# Patient Record
Sex: Female | Born: 1955 | Race: Black or African American | Hispanic: No | Marital: Married | Smoking: Never smoker
Health system: Southern US, Community
[De-identification: ages and names within clinical notes are randomized; demographics above are authoritative.]

## PROBLEM LIST (undated history)

## (undated) DIAGNOSIS — Z8619 Personal history of other infectious and parasitic diseases: Secondary | ICD-10-CM

## (undated) DIAGNOSIS — E559 Vitamin D deficiency, unspecified: Secondary | ICD-10-CM

## (undated) DIAGNOSIS — J069 Acute upper respiratory infection, unspecified: Secondary | ICD-10-CM

## (undated) DIAGNOSIS — Z860100 Personal history of colon polyps, unspecified: Secondary | ICD-10-CM

## (undated) DIAGNOSIS — G5603 Carpal tunnel syndrome, bilateral upper limbs: Secondary | ICD-10-CM

## (undated) DIAGNOSIS — K529 Noninfective gastroenteritis and colitis, unspecified: Secondary | ICD-10-CM

## (undated) DIAGNOSIS — K52832 Lymphocytic colitis: Secondary | ICD-10-CM

## (undated) DIAGNOSIS — F17200 Nicotine dependence, unspecified, uncomplicated: Secondary | ICD-10-CM

## (undated) DIAGNOSIS — I10 Essential (primary) hypertension: Secondary | ICD-10-CM

## (undated) HISTORY — DX: Carpal tunnel syndrome, bilateral upper limbs: G56.03

## (undated) HISTORY — DX: Nicotine dependence, unspecified, uncomplicated: F17.200

## (undated) HISTORY — DX: Personal history of other infectious and parasitic diseases: Z86.19

## (undated) HISTORY — DX: Lymphocytic colitis: K52.832

## (undated) HISTORY — DX: Vitamin D deficiency, unspecified: E55.9

## (undated) HISTORY — PX: TONSILLECTOMY: SUR1361

## (undated) HISTORY — DX: Personal history of colonic polyps: Z86.010

## (undated) HISTORY — PX: OTHER SURGICAL HISTORY: SHX169

## (undated) HISTORY — DX: Noninfective gastroenteritis and colitis, unspecified: K52.9

## (undated) HISTORY — PX: TUBAL LIGATION: SHX77

## (undated) HISTORY — DX: Personal history of colon polyps, unspecified: Z86.0100

## (undated) HISTORY — PX: COLONOSCOPY: SHX174

---

## 1998-05-04 ENCOUNTER — Encounter: Payer: Self-pay | Admitting: Emergency Medicine

## 1998-05-04 ENCOUNTER — Emergency Department (HOSPITAL_COMMUNITY): Admission: EM | Admit: 1998-05-04 | Discharge: 1998-05-04 | Payer: Self-pay | Admitting: Emergency Medicine

## 1998-05-24 ENCOUNTER — Other Ambulatory Visit: Admission: RE | Admit: 1998-05-24 | Discharge: 1998-05-24 | Payer: Self-pay | Admitting: Obstetrics & Gynecology

## 2000-03-07 ENCOUNTER — Other Ambulatory Visit: Admission: RE | Admit: 2000-03-07 | Discharge: 2000-03-07 | Payer: Self-pay | Admitting: Obstetrics and Gynecology

## 2002-01-06 ENCOUNTER — Ambulatory Visit (HOSPITAL_COMMUNITY): Admission: RE | Admit: 2002-01-06 | Discharge: 2002-01-06 | Payer: Self-pay | Admitting: Obstetrics and Gynecology

## 2002-01-06 ENCOUNTER — Encounter: Payer: Self-pay | Admitting: Obstetrics and Gynecology

## 2002-03-20 ENCOUNTER — Other Ambulatory Visit: Admission: RE | Admit: 2002-03-20 | Discharge: 2002-03-20 | Payer: Self-pay | Admitting: Obstetrics and Gynecology

## 2003-09-22 ENCOUNTER — Other Ambulatory Visit: Admission: RE | Admit: 2003-09-22 | Discharge: 2003-09-22 | Payer: Self-pay | Admitting: Obstetrics and Gynecology

## 2004-08-30 ENCOUNTER — Encounter: Admission: RE | Admit: 2004-08-30 | Discharge: 2004-08-30 | Payer: Self-pay | Admitting: Obstetrics and Gynecology

## 2004-11-17 ENCOUNTER — Other Ambulatory Visit: Admission: RE | Admit: 2004-11-17 | Discharge: 2004-11-17 | Payer: Self-pay | Admitting: Obstetrics and Gynecology

## 2005-01-19 ENCOUNTER — Emergency Department (HOSPITAL_COMMUNITY): Admission: EM | Admit: 2005-01-19 | Discharge: 2005-01-19 | Payer: Self-pay | Admitting: Family Medicine

## 2005-05-20 ENCOUNTER — Emergency Department (HOSPITAL_COMMUNITY): Admission: EM | Admit: 2005-05-20 | Discharge: 2005-05-20 | Payer: Self-pay | Admitting: Emergency Medicine

## 2006-02-26 ENCOUNTER — Encounter: Admission: RE | Admit: 2006-02-26 | Discharge: 2006-02-26 | Payer: Self-pay | Admitting: Family Medicine

## 2006-07-02 ENCOUNTER — Ambulatory Visit: Payer: Self-pay | Admitting: Internal Medicine

## 2006-08-07 ENCOUNTER — Ambulatory Visit: Payer: Self-pay | Admitting: Internal Medicine

## 2008-09-24 ENCOUNTER — Encounter: Admission: RE | Admit: 2008-09-24 | Discharge: 2008-09-24 | Payer: Self-pay | Admitting: Family Medicine

## 2008-12-20 ENCOUNTER — Emergency Department (HOSPITAL_COMMUNITY): Admission: EM | Admit: 2008-12-20 | Discharge: 2008-12-20 | Payer: Self-pay | Admitting: Emergency Medicine

## 2009-12-21 ENCOUNTER — Encounter: Admission: RE | Admit: 2009-12-21 | Discharge: 2009-12-21 | Payer: Self-pay | Admitting: Obstetrics and Gynecology

## 2010-08-14 ENCOUNTER — Encounter: Payer: Self-pay | Admitting: Obstetrics and Gynecology

## 2010-12-09 NOTE — Assessment & Plan Note (Signed)
[facility]                             PULMONARY OFFICE NOTE   NAME:[name] [name], [name] [name]                        MRN:          [id number]  DATE:07/02/2006                            DOB:          1956-04-08    REASON FOR CONSULTATION:  Cough.   HISTORY:  This is a 55 year old black female, never smoked.  Carries a  diagnosis of a chronic cough that is worse when she lies down,  associated with minimum sputum production; but frequent nocturnal  awaking for over a year.  The referral note says that she was tried on  Nexium and off of ACE inhibitors with no improvement.  The patient  states that although she was given Nexium she never took it, and  although she was supposed to be taking off an ACE inhibitor, she never  did change her medication -- which continues to be an ACE inhibitor,  which she doses every day but cannot name the name.  She does have a  history of bronchitis as a child for several years; was treated with  medications, but has not taken any medications since childhood for  asthma.  However, she was started on Symbicort and Allegra with no  apparent improvement in her cough.  She denies any significant weather  or environmental triggers for her cough, or any obvious alleviating  factors.  She did not even improve on prednisone.   PAST MEDICAL HISTORY:  Hypertension.   ALLERGIES:  BIAXIN.   MEDICATIONS:  1. Blood pressure one daily (she thinks it starts with Lo).  2. Symbicort  2 puffs twice daily.   SOCIAL HISTORY:  She has never smoked.  She has worked as a Producer, television/film/video  for 27 years, and it may have made her cough worse, but not sure.   FAMILY HISTORY:  Recorded in detail in the worksheet.  Significant for  the absence of respiratory complaints, asthma or atopy.   REVIEW OF SYSTEMS:  Taken also in detail in the worksheet.  Negative,  except as outlined above.   PHYSICAL EXAMINATION:  GENERAL:  This is a pleasant ambulatory  obese  black female in acute distress, weighing 260 pounds.  VITAL SIGNS:  Blood pressure 124/80.  HEENT:  Significant for hoarseness.  Oropharynx, however, is clear, with  no posterior drainage or cobblestoning.  Nasal turbinates are normal.  Ear canals were clear bilaterally.  NECK:  Supple without cervical adenopathy or tenderness.  Trachea  midline.  LUNGS:  Lung fields reveal pan expiratory wheeze bilaterally.  CARDIAC:  There is a regular rhythm, without murmur, gallop or rub  present.  ABDOMEN:  Soft and benign, without palpable organomegaly, mass or  tenderness.  EXTREMITIES:  Warm without calf tenderness, clubbing, cyanosis or edema.   LABORATORY STUDIES:  Hemoglobin saturation 98% on room air.   Chest CT scan from June 29, 2006 was reviewed and reveals, what  appears to be, a very tiny subpleural nodule in the right upper lobe  which is felt to be unchanged from previous August 28, 2005.  PFTs were  performed today  and are normal.   IMPRESSION:  Chronic cough in a patient who is overweight on ACE  inhibitors.  This probably represents a combination of reflux and ACE  inhibitor intolerance.  The reflux contributes to upper airway  instability, and then the cough because of direct inflammatory effects.  But, once the coughing occurs, the inflammation is much worse and then  of course the ACE inhibitor prevents the degradation of bradykinin (a  potent upper airway mediator of inflammation).   I therefore recommend the following:  I would avoid ACE inhibitors both now and in the future.  I will replace  her ACE inhibitor with Benicar 20 mg one daily.  I would do this for at  least 6 weeks before additional workup.  In the meantime, I added  Zegerid 40 mg at bedtime and went over it very carefully with her a  gastroesophageal reflux disease diet.   To reduce cough inducing cycle from developing and self-perpetuating, I  recommended Delsym cough syrup; with the  possibility of adding a  narcotic-containing cough medicine on her next visit if she is not  improved off of ACE inhibitors.  In the meantime I have recommended she  stop Symbicort at this point, since she does not really feel it helped.     Charlaine Dalton. Sherene Sires, MD, [facility]  Electronically Signed    MBW/MedQ  DD: 07/02/2006  DT: 07/03/2006  Job #: [id number]   cc:   Gretta Arab. Valentina Lucks, M.D.

## 2010-12-09 NOTE — Assessment & Plan Note (Signed)
[facility]                             PULMONARY OFFICE NOTE   NAME:[name] [name] GRAVER                        MRN:          [id number]  DATE:08/07/2006                            DOB:          05/27/1956    PULMONARY EXTENDED FINAL FOLLOWUP OFFICE VISIT:   HISTORY:  [age]-year-old black female that was seen in my office on  July 02, 2006, with a greater than one-year history of cough,  previously diagnosed as asthma by one of the walk-in clinics, but not  responding to Symbicort.  She returns for followup PFTs as requested,  having never smoked, for a final followup visit, stating the cough is  100% resolved.  It turned out that she had been on an ACE inhibitor.  (It was unclear from the initial visit exactly what she was taking and I  was never able to identify the name of the medicine.)  She was given  Benicar 20 mg one half daily after she told me she was on a blood  pressure pill she could not identify, and states within a week of  starting the medicine and stopping her old blood pressure pill the  cough resolved.  She thinks the pill may have been Lotensin when I gave  her a list of medicines to pick from, but she is not sure.   In any case, she comes back now with no significant dyspnea or cough and  is delighted to be breathing better.  She is no longer using any form  of inhaler or antihistamine/decongestant.   PHYSICAL EXAMINATION:  She is a robust, ambulatory, obese black female,  in no acute distress.  She weighs 261 pounds, which is no change from  baseline.  HEENT is unremarkable.  Oropharynx clear.  Lung fields are  perfectly clear bilaterally to auscultation and percussion.  There is  regular rhythm without murmur, gallop or rub.  Abdomen is soft, benign.  Extremities are warm without calf tenderness, cyanosis, clubbing or  edema.   PFTs were reviewed from July 02, 2006, and are completely normal,  except for a  disproportionate reduction in ERV, consistent with the  effects of obesity.  Diffusion capacity corrected to normal.   IMPRESSION:  1. No evidence of active pulmonary problem.  The cough, I believe, was      related to ACE inhibitors.  This does not get [place] completely off      the hook, because ACE inhibitors typically exacerbate a cough that      is present from a secondary mechanism, such as in this case,      related to morbid obesity with possible reflux.  She did not take      the Zegerid that I recommended, but at this point, does not need      to.  She does need to follow the diet I gave her and I would be      happy to see her back if the cough exacerbates, but the first thing      I would do is add back PPI therapy and  continue to work on weight-      loss.  2. Her blood pressure appears well-controlled on Benicar 20 mg one      half daily.  I gave her enough samples to last six weeks and asked      her to see Dr. Valentina [name] for long-term management of her blood      pressure.  However, I would definitely avoid ACE inhibitors here.  3. Finally, I reviewed with her the fact that she does have a solitary      pulmonary nodule in the right lung, consistent with a benign      granuloma, with no followup needed per the last CT report that I      reviewed with her, dated June 29, 2006, and compared to a      previous study, dated August 28, 2005.  I do not believe this has      anything to do with the cough and I agree that nothing further      needs to be done in terms of followup in this never-smoker.   In fact, pulmonary followup is on a p.r.n. basis only.     Charlaine Dalton. Sherene Sires, MD, [facility]  Electronically Signed    MBW/MedQ  DD: 08/07/2006  DT: 08/08/2006  Job #: [id number]   cc:   Gretta Arab. [name] [name], M.D.

## 2013-04-15 ENCOUNTER — Encounter: Payer: Self-pay | Admitting: *Deleted

## 2013-04-15 NOTE — Telephone Encounter (Signed)
error 

## 2014-10-23 DIAGNOSIS — J069 Acute upper respiratory infection, unspecified: Secondary | ICD-10-CM

## 2014-10-23 HISTORY — DX: Acute upper respiratory infection, unspecified: J06.9

## 2015-02-05 ENCOUNTER — Emergency Department (HOSPITAL_COMMUNITY): Payer: No Typology Code available for payment source

## 2015-02-05 ENCOUNTER — Emergency Department (HOSPITAL_COMMUNITY)
Admission: EM | Admit: 2015-02-05 | Discharge: 2015-02-05 | Disposition: A | Discharge status: Home / Self Care | Payer: No Typology Code available for payment source | Attending: Emergency Medicine | Admitting: Emergency Medicine

## 2015-02-05 ENCOUNTER — Encounter (HOSPITAL_COMMUNITY): Payer: Self-pay | Admitting: Emergency Medicine

## 2015-02-05 DIAGNOSIS — S3991XA Unspecified injury of abdomen, initial encounter: Secondary | ICD-10-CM | POA: Insufficient documentation

## 2015-02-05 DIAGNOSIS — Y92410 Unspecified street and highway as the place of occurrence of the external cause: Secondary | ICD-10-CM | POA: Diagnosis not present

## 2015-02-05 DIAGNOSIS — Z79899 Other long term (current) drug therapy: Secondary | ICD-10-CM | POA: Diagnosis not present

## 2015-02-05 DIAGNOSIS — Y9389 Activity, other specified: Secondary | ICD-10-CM | POA: Diagnosis not present

## 2015-02-05 DIAGNOSIS — I10 Essential (primary) hypertension: Secondary | ICD-10-CM | POA: Diagnosis not present

## 2015-02-05 DIAGNOSIS — S299XXA Unspecified injury of thorax, initial encounter: Secondary | ICD-10-CM | POA: Insufficient documentation

## 2015-02-05 DIAGNOSIS — Y999 Unspecified external cause status: Secondary | ICD-10-CM | POA: Insufficient documentation

## 2015-02-05 DIAGNOSIS — S6992XA Unspecified injury of left wrist, hand and finger(s), initial encounter: Secondary | ICD-10-CM | POA: Diagnosis not present

## 2015-02-05 HISTORY — DX: Essential (primary) hypertension: I10

## 2015-02-05 LAB — I-STAT CREATININE, ED: Creatinine, Ser: 0.6 mg/dL (ref 0.44–1.00)

## 2015-02-05 MED ORDER — METHOCARBAMOL 500 MG PO TABS: 500.0000 mg | ORAL_TABLET | Freq: Two times a day (BID) | ORAL | Status: DC

## 2015-02-05 MED ORDER — HYDROCODONE-ACETAMINOPHEN 5-325 MG PO TABS: 1.0000 | ORAL_TABLET | ORAL | Status: DC | PRN

## 2015-02-05 MED ORDER — IOHEXOL 300 MG/ML  SOLN
100.0000 mL | Freq: Once | INTRAMUSCULAR | Status: AC | PRN
  Administered 2015-02-05: 100 mL via INTRAVENOUS
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
## 2015-02-05 NOTE — ED Notes (Signed)
Pt changed to level 3 per EDPA, noted to have abrasion/bruising along lower abdomen.
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
## 2015-06-07 ENCOUNTER — Other Ambulatory Visit: Payer: Self-pay | Admitting: Obstetrics and Gynecology

## 2015-06-14 ENCOUNTER — Encounter (HOSPITAL_COMMUNITY): Payer: Self-pay

## 2015-06-14 ENCOUNTER — Encounter (HOSPITAL_COMMUNITY)
Admission: RE | Admit: 2015-06-14 | Discharge: 2015-06-14 | Disposition: A | Discharge status: Home / Self Care | Payer: BC Managed Care – PPO | Source: Ambulatory Visit | Attending: Obstetrics and Gynecology | Admitting: Obstetrics and Gynecology

## 2015-06-14 DIAGNOSIS — Z01818 Encounter for other preprocedural examination: Secondary | ICD-10-CM | POA: Diagnosis present

## 2015-06-14 HISTORY — DX: Acute upper respiratory infection, unspecified: J06.9

## 2015-06-14 LAB — CBC
HCT: 39.2 % (ref 36.0–46.0)
Hemoglobin: 13.4 g/dL (ref 12.0–15.0)
MCH: 30 pg (ref 26.0–34.0)
MCHC: 34.2 g/dL (ref 30.0–36.0)
MCV: 87.7 fL (ref 78.0–100.0)
PLATELETS: 245 10*3/uL (ref 150–400)
RBC: 4.47 MIL/uL (ref 3.87–5.11)
RDW: 13.7 % (ref 11.5–15.5)
WBC: 8.1 10*3/uL (ref 4.0–10.5)

## 2015-06-14 LAB — BASIC METABOLIC PANEL
ANION GAP: 9 (ref 5–15)
BUN: 15 mg/dL (ref 6–20)
CALCIUM: 9 mg/dL (ref 8.9–10.3)
CO2: 31 mmol/L (ref 22–32)
CREATININE: 0.66 mg/dL (ref 0.44–1.00)
Chloride: 100 mmol/L — ABNORMAL LOW (ref 101–111)
Glucose, Bld: 91 mg/dL (ref 65–99)
Potassium: 3.3 mmol/L — ABNORMAL LOW (ref 3.5–5.1)
SODIUM: 140 mmol/L (ref 135–145)

## 2015-06-14 NOTE — Patient Instructions (Addendum)
Your procedure is scheduled on:  Friday, Dec. 2, 2016  [facility]  7:00 AM  Pick up the phone at the desk and dial [phone].  Call this number if you have problems the morning of surgery: [phone].  Remember: Do NOT eat food or drink after:  Midnight Thursday, Dec. 1, 2016  Take these medicines the morning of surgery with a SIP OF WATER:  Losartan, [place]  Do NOT wear jewelry (body piercing), metal hair clips/bobby pins, make-up, or nail polish. Do NOT wear lotions, powders, or perfumes.  You may wear deoderant. Do NOT shave for 48 hours prior to surgery. Do NOT bring valuables to the hospital. Contacts, dentures, or bridgework may not be worn into surgery.  Have a responsible adult drive you home and stay with you for 24 hours after your procedure

## 2015-06-25 ENCOUNTER — Ambulatory Visit (HOSPITAL_COMMUNITY)
Admission: RE | Admit: 2015-06-25 | Payer: BC Managed Care – PPO | Source: Ambulatory Visit | Admitting: Obstetrics and Gynecology

## 2015-06-25 ENCOUNTER — Encounter (HOSPITAL_COMMUNITY): Admission: RE | Payer: Self-pay | Source: Ambulatory Visit

## 2015-06-25 SURGERY — DILATATION AND CURETTAGE /HYSTEROSCOPY
Anesthesia: Choice

## 2015-07-22 ENCOUNTER — Other Ambulatory Visit: Payer: Self-pay | Admitting: Obstetrics and Gynecology

## 2015-07-22 DIAGNOSIS — N6311 Unspecified lump in the right breast, upper outer quadrant: Secondary | ICD-10-CM

## 2015-07-22 DIAGNOSIS — N6310 Unspecified lump in the right breast, unspecified quadrant: Principal | ICD-10-CM

## 2015-07-22 DIAGNOSIS — N6315 Unspecified lump in the right breast, overlapping quadrants: Secondary | ICD-10-CM

## 2015-07-23 ENCOUNTER — Ambulatory Visit
Admission: RE | Admit: 2015-07-23 | Discharge: 2015-07-23 | Disposition: A | Discharge status: Home / Self Care | Payer: BC Managed Care – PPO | Source: Ambulatory Visit | Attending: Obstetrics and Gynecology | Admitting: Obstetrics and Gynecology

## 2015-07-23 DIAGNOSIS — N6311 Unspecified lump in the right breast, upper outer quadrant: Secondary | ICD-10-CM

## 2015-07-23 DIAGNOSIS — N6310 Unspecified lump in the right breast, unspecified quadrant: Principal | ICD-10-CM

## 2015-07-23 DIAGNOSIS — N6315 Unspecified lump in the right breast, overlapping quadrants: Secondary | ICD-10-CM

## 2015-10-08 ENCOUNTER — Other Ambulatory Visit: Payer: Self-pay | Admitting: Family Medicine

## 2015-10-08 ENCOUNTER — Ambulatory Visit
Admission: RE | Admit: 2015-10-08 | Discharge: 2015-10-08 | Disposition: A | Discharge status: Home / Self Care | Payer: BC Managed Care – PPO | Source: Ambulatory Visit | Attending: Family Medicine | Admitting: Family Medicine

## 2015-10-08 DIAGNOSIS — M25571 Pain in right ankle and joints of right foot: Secondary | ICD-10-CM

## 2016-01-05 ENCOUNTER — Other Ambulatory Visit: Payer: Self-pay | Admitting: Hematology & Oncology

## 2016-03-16 ENCOUNTER — Ambulatory Visit (INDEPENDENT_AMBULATORY_CARE_PROVIDER_SITE_OTHER): Payer: BC Managed Care – PPO

## 2016-03-16 ENCOUNTER — Ambulatory Visit (INDEPENDENT_AMBULATORY_CARE_PROVIDER_SITE_OTHER): Payer: BC Managed Care – PPO | Admitting: Podiatry

## 2016-03-16 ENCOUNTER — Encounter: Payer: Self-pay | Admitting: Podiatry

## 2016-03-16 DIAGNOSIS — M66871 Spontaneous rupture of other tendons, right ankle and foot: Secondary | ICD-10-CM | POA: Diagnosis not present

## 2016-03-16 DIAGNOSIS — M25571 Pain in right ankle and joints of right foot: Secondary | ICD-10-CM | POA: Diagnosis not present
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
## 2016-03-17 ENCOUNTER — Telehealth: Payer: Self-pay | Admitting: *Deleted

## 2016-03-17 NOTE — Telephone Encounter (Signed)
MRI orders given to D. Meadows for pre-cert. 

## 2016-03-28 ENCOUNTER — Telehealth: Payer: Self-pay

## 2016-03-28 NOTE — Telephone Encounter (Signed)
PA #[id number] MRI Rt ankle wo contrast [facility] [facility] faxed over PA #

## 2016-03-29 ENCOUNTER — Ambulatory Visit
Admission: RE | Admit: 2016-03-29 | Discharge: 2016-03-29 | Disposition: A | Discharge status: Home / Self Care | Payer: BC Managed Care – PPO | Source: Ambulatory Visit | Attending: Podiatry | Admitting: Podiatry

## 2016-03-29 DIAGNOSIS — M66871 Spontaneous rupture of other tendons, right ankle and foot: Secondary | ICD-10-CM

## 2016-04-27 ENCOUNTER — Telehealth: Payer: Self-pay | Admitting: Podiatry

## 2016-04-27 NOTE — Telephone Encounter (Signed)
lvm for pt to call to schedule an appt to see Dr Hyatt to discuss mri results. °

## 2016-05-03 ENCOUNTER — Encounter: Payer: Self-pay | Admitting: Podiatry

## 2016-05-09 ENCOUNTER — Ambulatory Visit: Payer: BC Managed Care – PPO | Admitting: Podiatry

## 2016-05-30 ENCOUNTER — Encounter: Payer: Self-pay | Admitting: Podiatry

## 2016-05-30 ENCOUNTER — Ambulatory Visit (INDEPENDENT_AMBULATORY_CARE_PROVIDER_SITE_OTHER): Payer: BC Managed Care – PPO | Admitting: Podiatry

## 2016-05-30 DIAGNOSIS — M779 Enthesopathy, unspecified: Secondary | ICD-10-CM | POA: Diagnosis not present

## 2016-05-31 NOTE — Progress Notes (Signed)
She presents today for follow-up for MRI. She states that the tendon feels much better but the pain is gone to the lateral side as she points to the sinus tarsi of the right foot.  Objective: Vital signs are stable alert and oriented 3 reviewed the radiographs and MRI with her today which demonstrates a hypertrophic tendinitis of the posterior tibial tendon without significant fraying or tearing. 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 She is pain on end range of motion of the subtalar joint and pain on palpation of the sinus tarsi right foot. No trauma to the foot.  Assessment: Posterior tibial tendinitis with subtalar joint capsulitis right  Plan: After sterile Betadine skin prep injected 20 mg of Kenalog and local anesthetic to the subtalar joint through the sinus tarsi. She tolerated this procedure well. I will follow-up with her in a few weeks to reassess the posterior tibial tendon.

## 2016-06-22 ENCOUNTER — Other Ambulatory Visit: Payer: Self-pay | Admitting: Obstetrics and Gynecology

## 2016-06-22 DIAGNOSIS — N63 Unspecified lump in unspecified breast: Secondary | ICD-10-CM

## 2016-06-27 ENCOUNTER — Ambulatory Visit
Admission: RE | Admit: 2016-06-27 | Discharge: 2016-06-27 | Disposition: A | Discharge status: Home / Self Care | Payer: BC Managed Care – PPO | Source: Ambulatory Visit | Attending: Obstetrics and Gynecology | Admitting: Obstetrics and Gynecology

## 2016-06-27 DIAGNOSIS — N63 Unspecified lump in unspecified breast: Secondary | ICD-10-CM

## 2016-07-11 ENCOUNTER — Ambulatory Visit: Payer: BC Managed Care – PPO | Admitting: Podiatry

## 2016-08-22 ENCOUNTER — Ambulatory Visit: Payer: BC Managed Care – PPO | Admitting: Podiatry

## 2017-06-05 ENCOUNTER — Other Ambulatory Visit: Payer: Self-pay | Admitting: Obstetrics and Gynecology

## 2017-06-05 DIAGNOSIS — R928 Other abnormal and inconclusive findings on diagnostic imaging of breast: Secondary | ICD-10-CM

## 2017-06-13 ENCOUNTER — Ambulatory Visit
Admission: RE | Admit: 2017-06-13 | Discharge: 2017-06-13 | Disposition: A | Discharge status: Home / Self Care | Payer: BC Managed Care – PPO | Source: Ambulatory Visit | Attending: Obstetrics and Gynecology | Admitting: Obstetrics and Gynecology

## 2017-06-13 ENCOUNTER — Other Ambulatory Visit: Payer: Self-pay | Admitting: Obstetrics and Gynecology

## 2017-06-13 DIAGNOSIS — R928 Other abnormal and inconclusive findings on diagnostic imaging of breast: Secondary | ICD-10-CM

## 2017-06-13 DIAGNOSIS — N6310 Unspecified lump in the right breast, unspecified quadrant: Secondary | ICD-10-CM

## 2017-06-19 ENCOUNTER — Ambulatory Visit
Admission: RE | Admit: 2017-06-19 | Discharge: 2017-06-19 | Disposition: A | Discharge status: Home / Self Care | Payer: BC Managed Care – PPO | Source: Ambulatory Visit | Attending: Obstetrics and Gynecology | Admitting: Obstetrics and Gynecology

## 2017-06-19 DIAGNOSIS — N6310 Unspecified lump in the right breast, unspecified quadrant: Secondary | ICD-10-CM

## 2017-10-02 ENCOUNTER — Encounter: Payer: Self-pay | Admitting: Vascular Surgery

## 2017-10-02 ENCOUNTER — Ambulatory Visit: Payer: BC Managed Care – PPO | Admitting: Vascular Surgery

## 2017-10-02 ENCOUNTER — Other Ambulatory Visit: Payer: Self-pay

## 2017-10-02 VITALS — BP 129/87 | HR 86 | Resp 20 | Ht 64.0 in | Wt 267.0 lb

## 2017-10-02 DIAGNOSIS — I83893 Varicose veins of bilateral lower extremities with other complications: Secondary | ICD-10-CM | POA: Diagnosis not present
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
## 2018-08-30 LAB — HM MAMMOGRAPHY

## 2018-08-30 LAB — HM PAP SMEAR

## 2018-09-27 ENCOUNTER — Other Ambulatory Visit: Payer: Self-pay

## 2018-09-27 ENCOUNTER — Emergency Department (HOSPITAL_COMMUNITY)
Admission: EM | Admit: 2018-09-27 | Discharge: 2018-09-27 | Disposition: A | Discharge status: Home / Self Care | Payer: BC Managed Care – PPO | Attending: Emergency Medicine | Admitting: Emergency Medicine

## 2018-09-27 DIAGNOSIS — Z79899 Other long term (current) drug therapy: Secondary | ICD-10-CM | POA: Insufficient documentation

## 2018-09-27 DIAGNOSIS — E876 Hypokalemia: Secondary | ICD-10-CM | POA: Diagnosis not present

## 2018-09-27 DIAGNOSIS — I10 Essential (primary) hypertension: Secondary | ICD-10-CM | POA: Diagnosis not present

## 2018-09-27 LAB — BASIC METABOLIC PANEL
Anion gap: 9 (ref 5–15)
BUN: 9 mg/dL (ref 8–23)
CALCIUM: 8.3 mg/dL — AB (ref 8.9–10.3)
CO2: 20 mmol/L — ABNORMAL LOW (ref 22–32)
CREATININE: 0.91 mg/dL (ref 0.44–1.00)
Chloride: 108 mmol/L (ref 98–111)
Glucose, Bld: 101 mg/dL — ABNORMAL HIGH (ref 70–99)
Potassium: 2.4 mmol/L — CL (ref 3.5–5.1)
SODIUM: 137 mmol/L (ref 135–145)

## 2018-09-27 LAB — CBC
HCT: 39.9 % (ref 36.0–46.0)
Hemoglobin: 13.5 g/dL (ref 12.0–15.0)
MCH: 29.1 pg (ref 26.0–34.0)
MCHC: 33.8 g/dL (ref 30.0–36.0)
MCV: 86 fL (ref 80.0–100.0)
NRBC: 0 % (ref 0.0–0.2)
PLATELETS: 260 10*3/uL (ref 150–400)
RBC: 4.64 MIL/uL (ref 3.87–5.11)
RDW: 13.8 % (ref 11.5–15.5)
WBC: 7.3 10*3/uL (ref 4.0–10.5)

## 2018-09-27 LAB — CBG MONITORING, ED: Glucose-Capillary: 60 mg/dL — ABNORMAL LOW (ref 70–99)

## 2018-09-27 LAB — URINALYSIS, ROUTINE W REFLEX MICROSCOPIC
BILIRUBIN URINE: NEGATIVE
Glucose, UA: NEGATIVE mg/dL
Hgb urine dipstick: NEGATIVE
KETONES UR: NEGATIVE mg/dL
LEUKOCYTE UA: NEGATIVE
NITRITE: NEGATIVE
PROTEIN: NEGATIVE mg/dL
Specific Gravity, Urine: 1.008 (ref 1.005–1.030)
pH: 6 (ref 5.0–8.0)

## 2018-09-27 LAB — MAGNESIUM: Magnesium: 1.4 mg/dL — ABNORMAL LOW (ref 1.7–2.4)

## 2018-09-27 MED ORDER — SODIUM CHLORIDE 0.9% FLUSH
3.0000 mL | Freq: Once | INTRAVENOUS | Status: AC
  Administered 2018-09-27: 3 mL via INTRAVENOUS

## 2018-09-27 MED ORDER — POTASSIUM CHLORIDE CRYS ER 20 MEQ PO TBCR
40.0000 meq | EXTENDED_RELEASE_TABLET | Freq: Once | ORAL | Status: AC
  Administered 2018-09-27: 40 meq via ORAL
  Filled 2018-09-27: qty 2

## 2018-09-27 MED ORDER — MAGNESIUM SULFATE 2 GM/50ML IV SOLN
2.0000 g | Freq: Once | INTRAVENOUS | Status: AC
  Administered 2018-09-27: 2 g via INTRAVENOUS
  Filled 2018-09-27: qty 50

## 2018-09-27 MED ORDER — POTASSIUM CHLORIDE 10 MEQ/100ML IV SOLN
10.0000 meq | INTRAVENOUS | Status: AC
  Administered 2018-09-27 (×2): 10 meq via INTRAVENOUS
  Filled 2018-09-27 (×2): qty 100

## 2018-09-27 MED ORDER — POTASSIUM CHLORIDE 10 MEQ/100ML IV SOLN
10.0000 meq | Freq: Once | INTRAVENOUS | Status: AC
  Administered 2018-09-27: 10 meq via INTRAVENOUS
  Filled 2018-09-27: qty 100

## 2018-09-27 MED ORDER — LACTATED RINGERS IV BOLUS
1000.0000 mL | Freq: Once | INTRAVENOUS | Status: AC
  Administered 2018-09-27: 1000 mL via INTRAVENOUS
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
## 2018-09-27 NOTE — Discharge Instructions (Addendum)
It was our pleasure to provide your ER care today - we hope that you feel better.  Your potassium level is low (2.4). Take your potassium consistently at the higher dose you have been instructed by your primary care doctor. Also eat plenty of fruits and vegetables.   Also from today's labs, your magnesium level is low - take magnesium supplement, or multivitamin such as Centrum.  Follow up with primary care doctor Monday for recheck/recheck of potassium.   Return to ER if worse, new symptoms, weak/fainting, other concern.

## 2018-09-27 NOTE — ED Provider Notes (Signed)
Patient signed out that recent diarrhea, k low - is receiving K and then plan to d/c to home, mg pending.   Mg is low. MG 2 gm iv. Po fluids/food.   Tolerating po. No faintness or dizziness. Vital signs normal.  Patient appears stable for d/c per Dr Arlington Calix plan.     Cathren Laine, MD 09/27/18 [phone]

## 2018-09-27 NOTE — ED Notes (Signed)
Patient verbalizes understanding of discharge instructions. Opportunity for questioning and answers were provided. Armband removed by staff, pt discharged from ED.  

## 2018-09-27 NOTE — ED Triage Notes (Signed)
Patient reports PCP told her to go to ED for IV fluids and potassium - patient states she has had intermittent N/V/D of unknown origin since January, possibly r/t diet and is waiting to see GI specialist. Had hypokalemia in January, treated, and then saw PCP yesterday for recheck and told K+ was 2.7. Patient denies any symptoms or pain at this time.

## 2018-10-04 IMAGING — MG MM CLIP PLACEMENT
5 series · 6 of 13 positions shown · non-contrast
Comparison: Previous exam(s).

CLINICAL DATA: Status post stereotactic core biopsy of right breast
mass

EXAM:
DIAGNOSTIC RIGHT MAMMOGRAM POST STEREOTACTIC BIOPSY

[R LM]
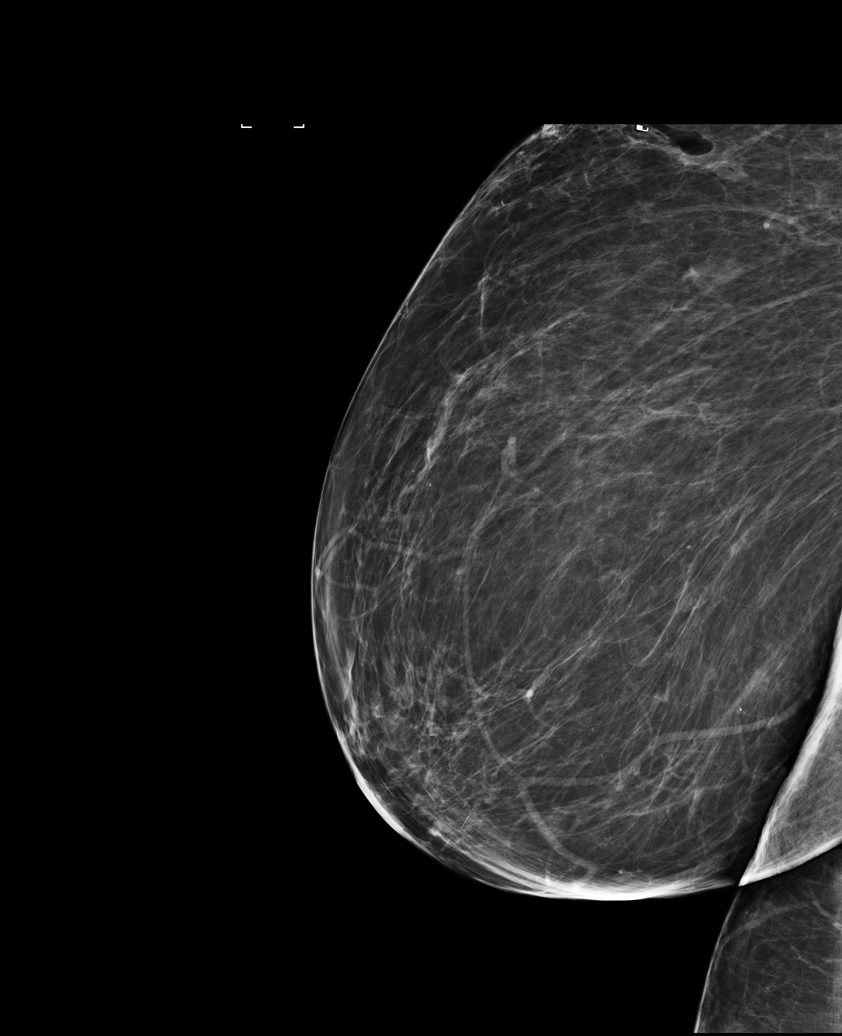

[R LM synth-2D]
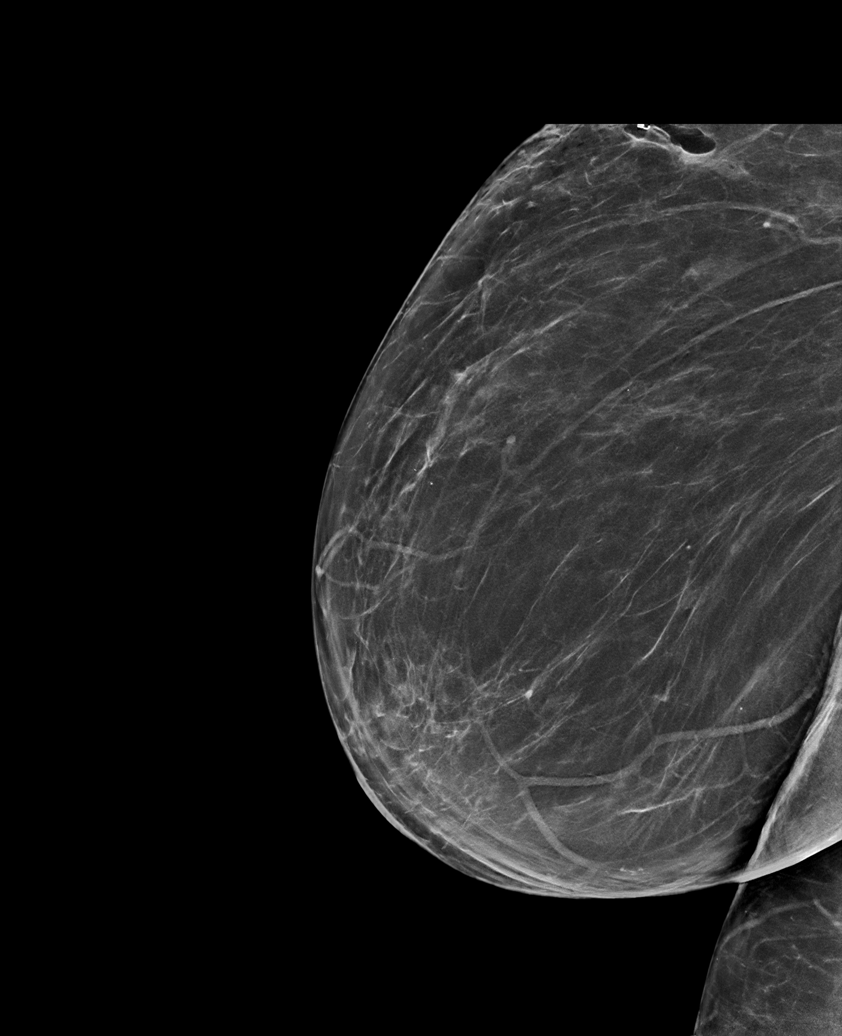

[R CC synth-2D]
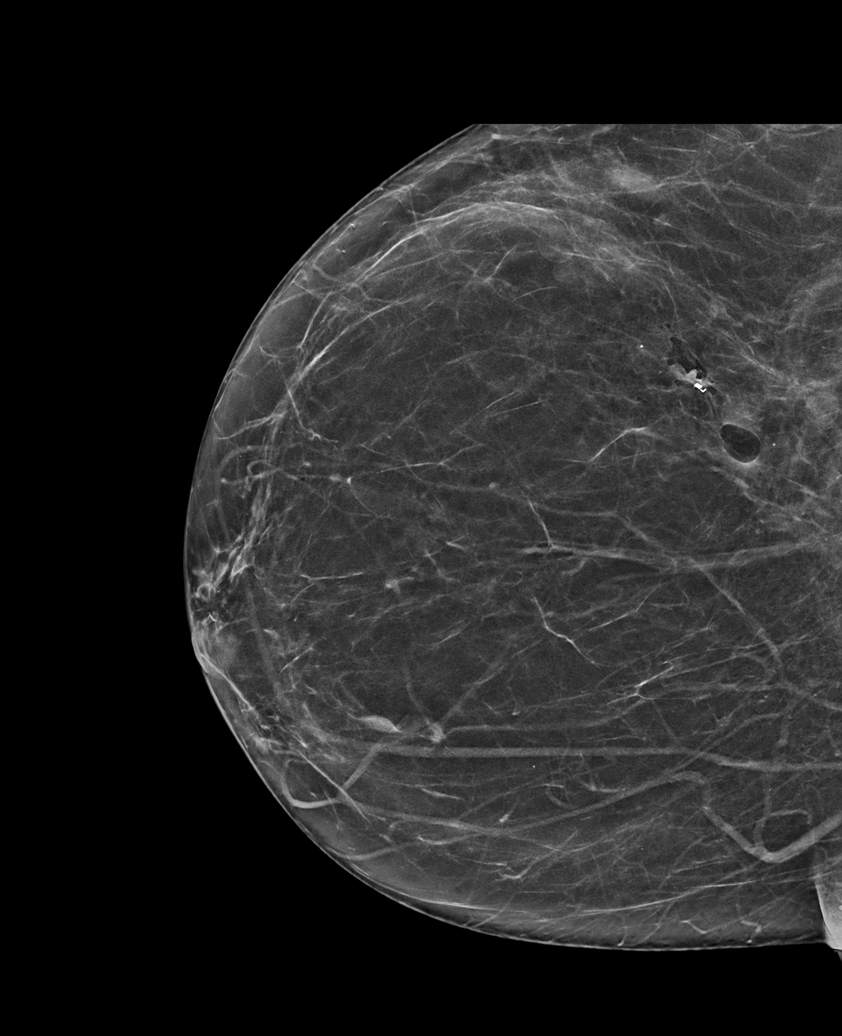

[R CC tomo · 2 of 71 frames shown]
[frame 23/71]
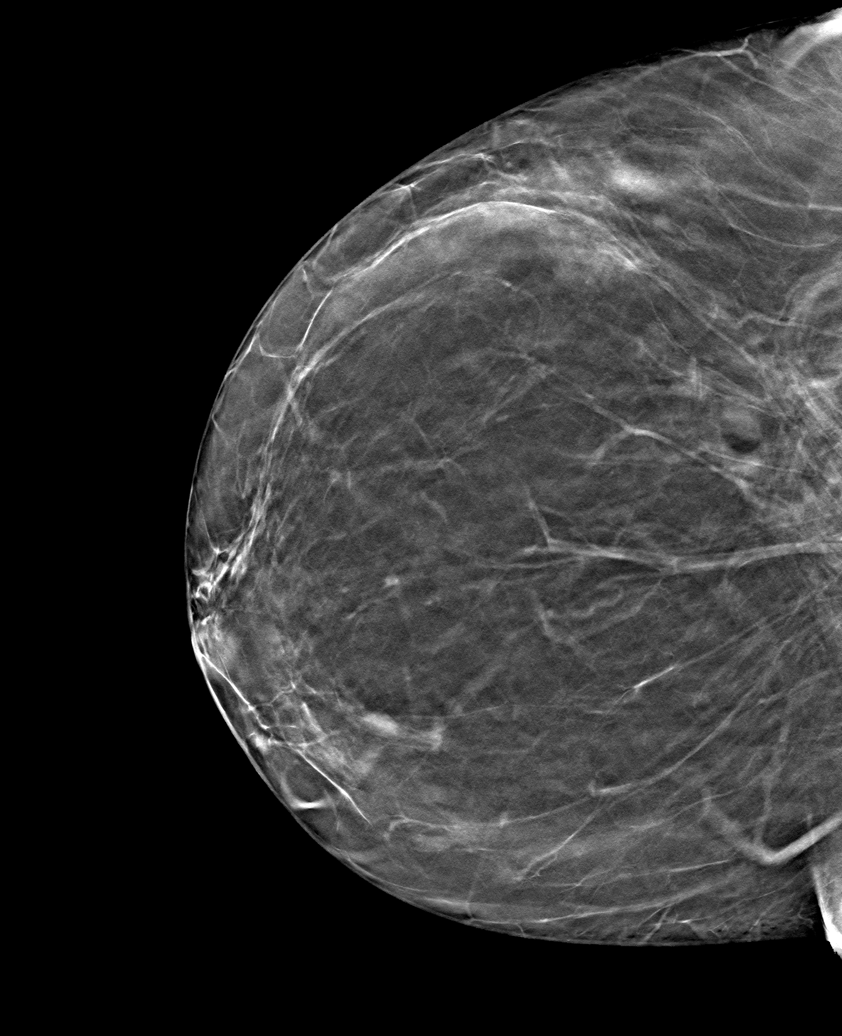
[frame 36/71]
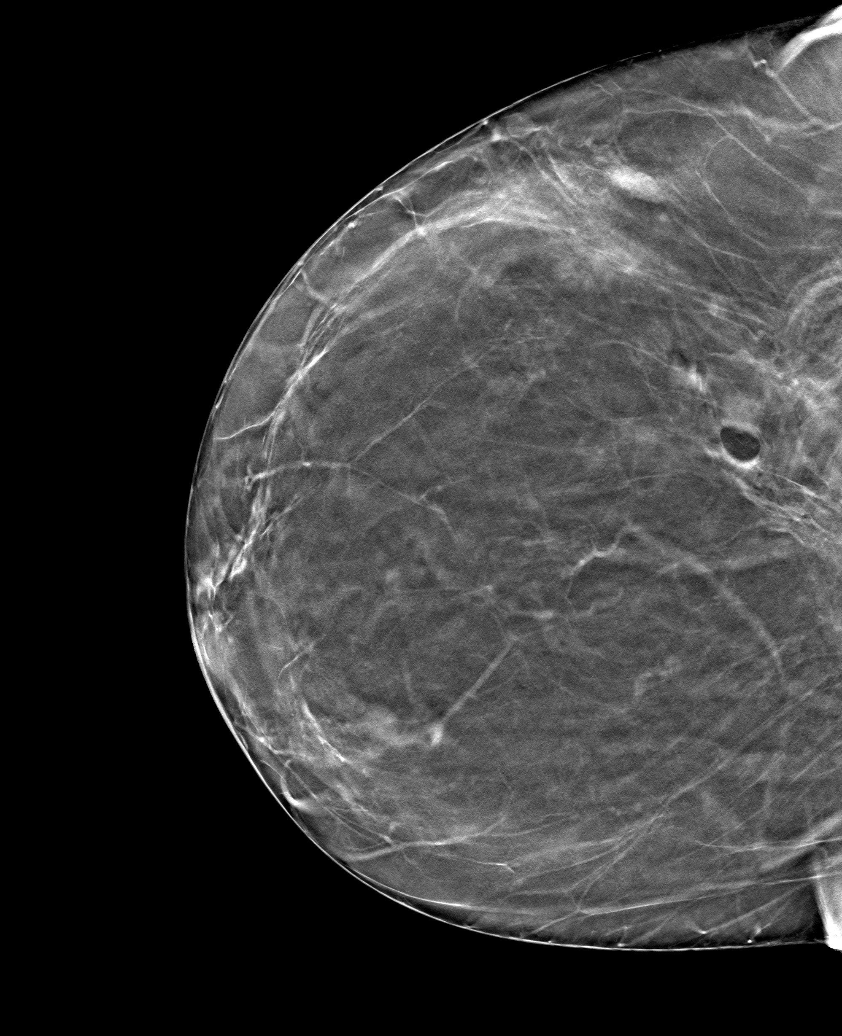

[R LM tomo · tomo slice 43/85.0]
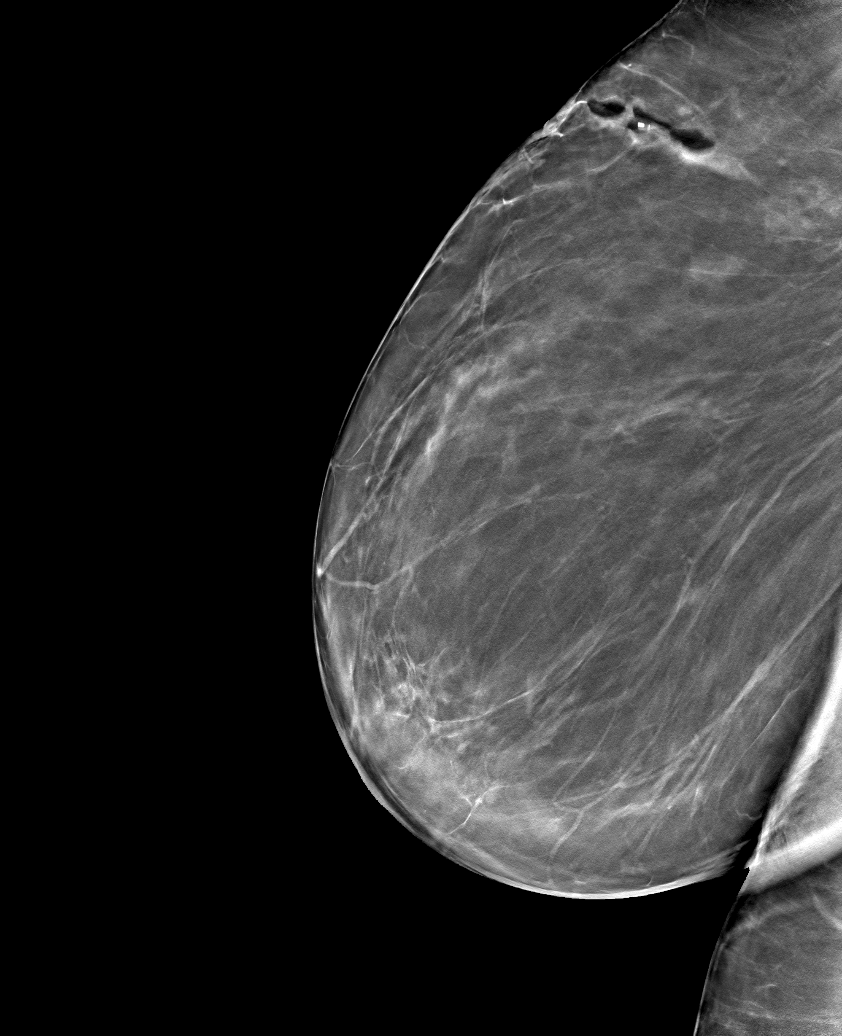

[6 of 13 positions shown; findings below may reference images not displayed]

FINDINGS: Mammographic images were obtained following right breast
stereotactic guided biopsy of mass in the upper-outer quadrant. Cc
and lateral views of the right breast demonstrate core biopsy clip
in the area of concern.
IMPRESSION: Post biopsy mammogram demonstrating biopsy clip in the area of
concern.

Final Assessment: Post Procedure Mammograms for Marker Placement

## 2018-10-04 IMAGING — MG STEREOTACTIC VACUUM ASSIST RIGHT
4 series · 4 of 16 positions shown · non-contrast
Comparison: Previous exams.

ADDENDUM:
Pathology revealed FAT NECROSIS of the Right breast, upper lateral.
This was found to be concordant by Dr. Tzuriel Warshavsky. Pathology
results were discussed with the patient by telephone. The patient
reported doing well after the biopsy with tenderness at the site.
Post biopsy instructions and care were reviewed and questions were
answered. The patient was encouraged to call The [REDACTED] of
instructed to return for annual screening mammography and informed a
reminder notice would be sent regarding this appointment.

Pathology results reported by Ramirez Salaam, RN on 06/20/2017.
CLINICAL DATA: Right breast mass for biopsy
EXAM:
RIGHT BREAST STEREOTACTIC CORE NEEDLE BIOPSY

[R LM]
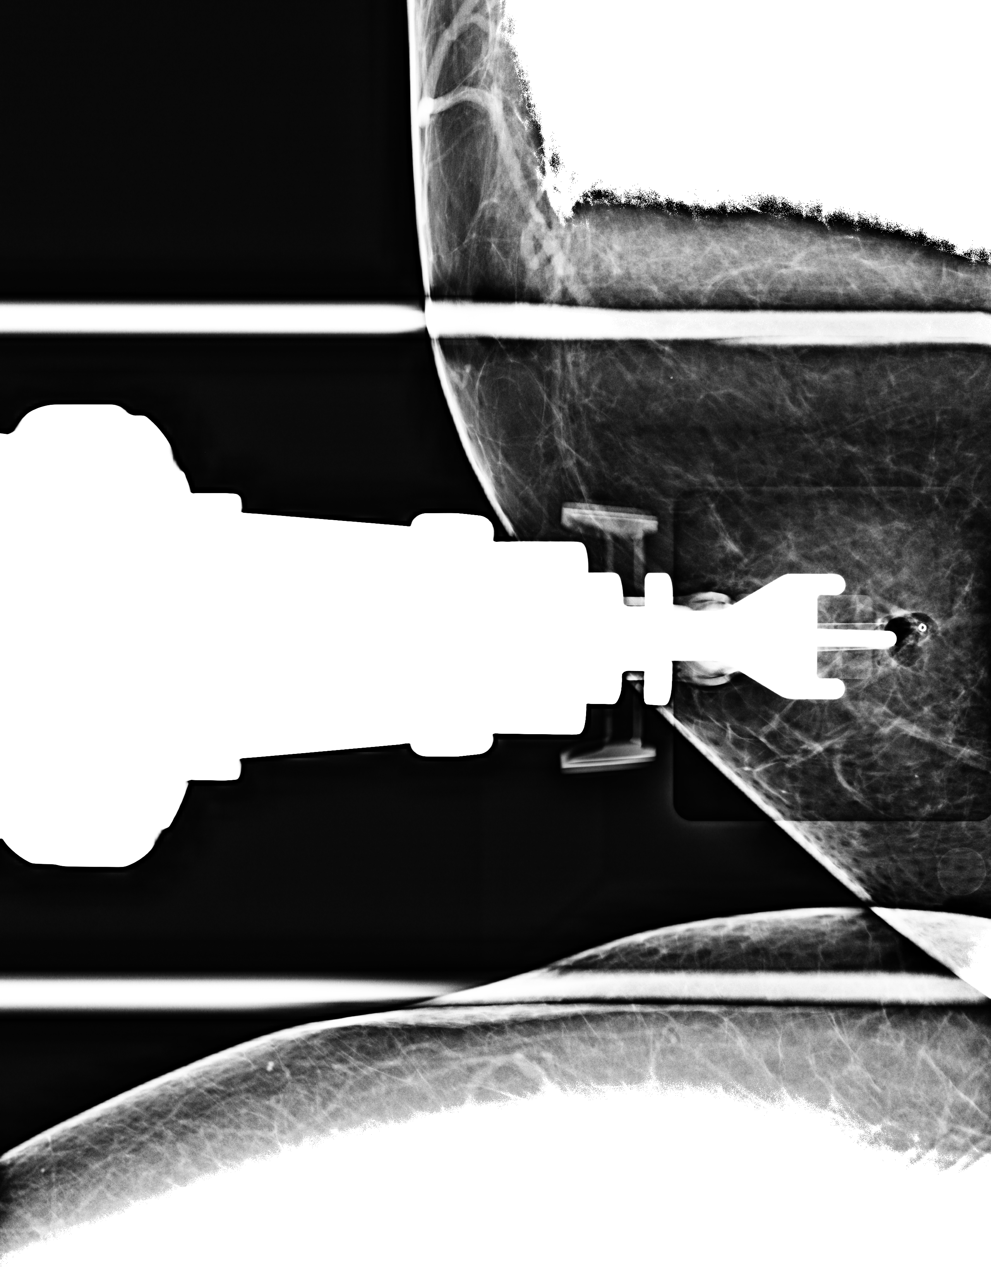

[R LM tomo (1 of 3) · tomo slice 27/53.0]
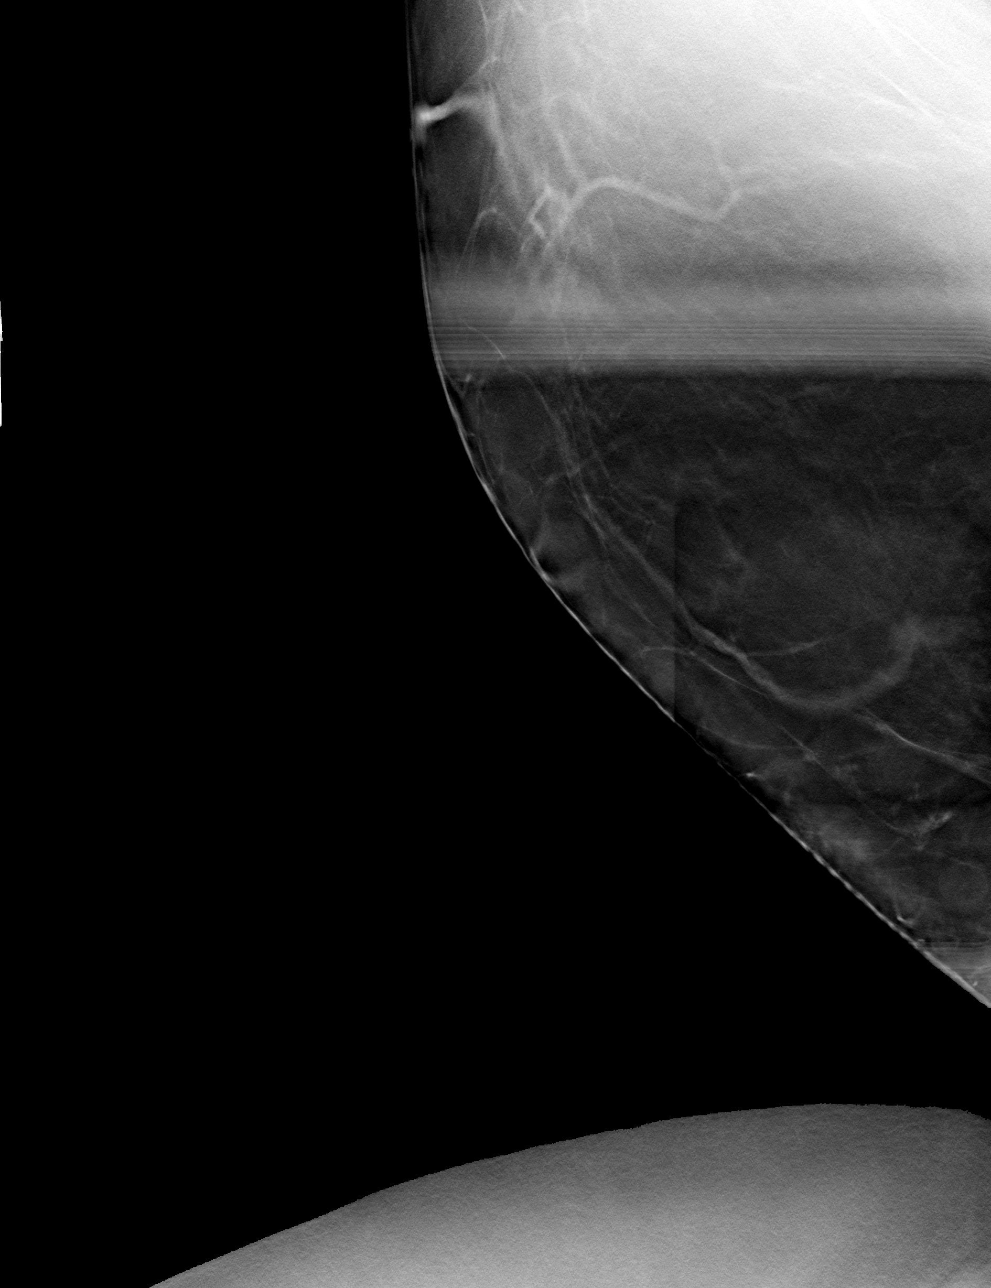

[R LM tomo (2 of 3) · tomo slice 27/52.0]
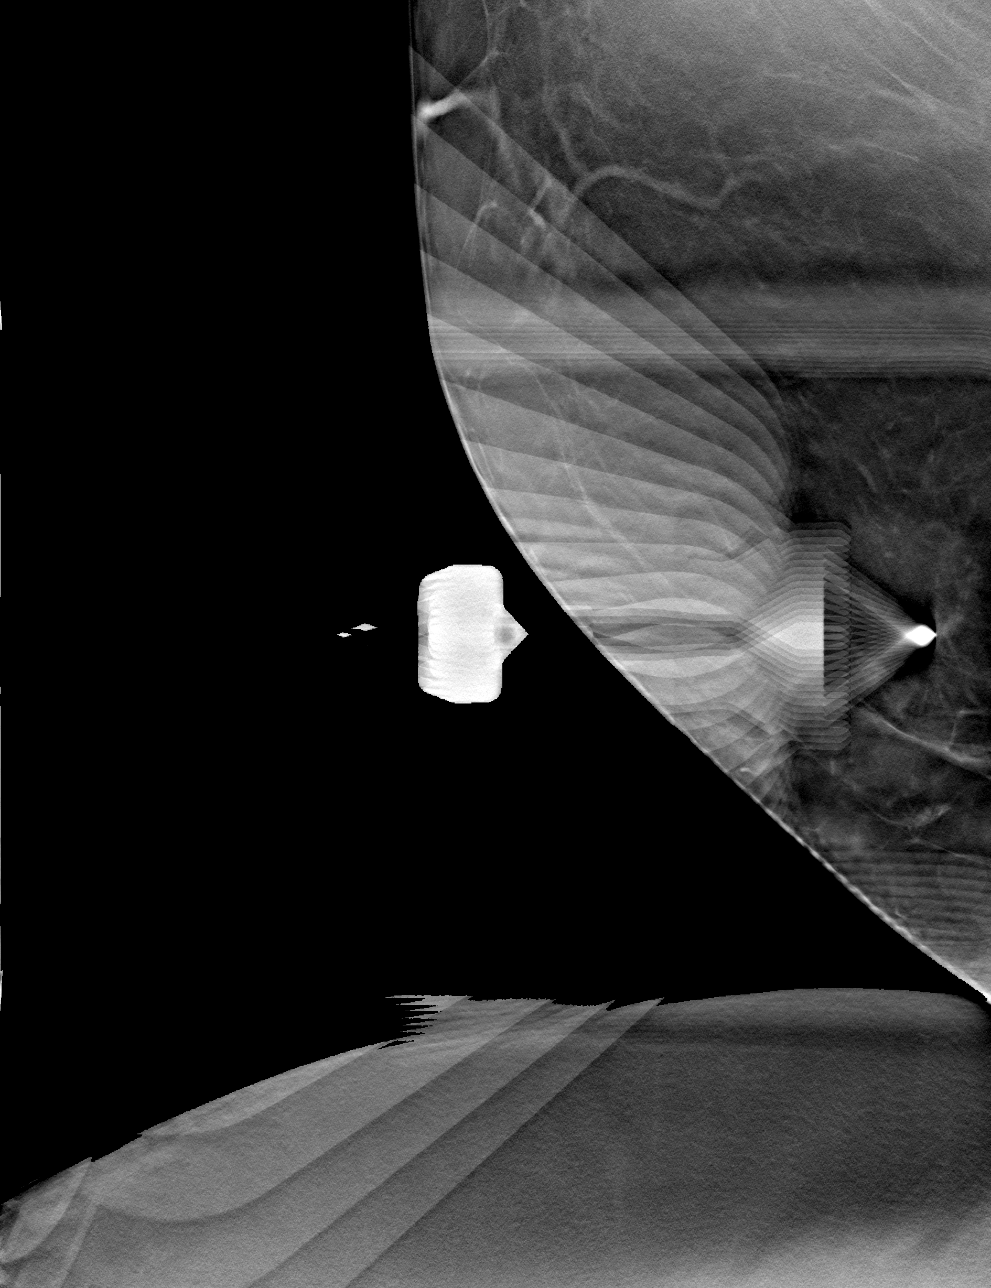

[R LM tomo (3 of 3) · tomo slice 27/52.0]
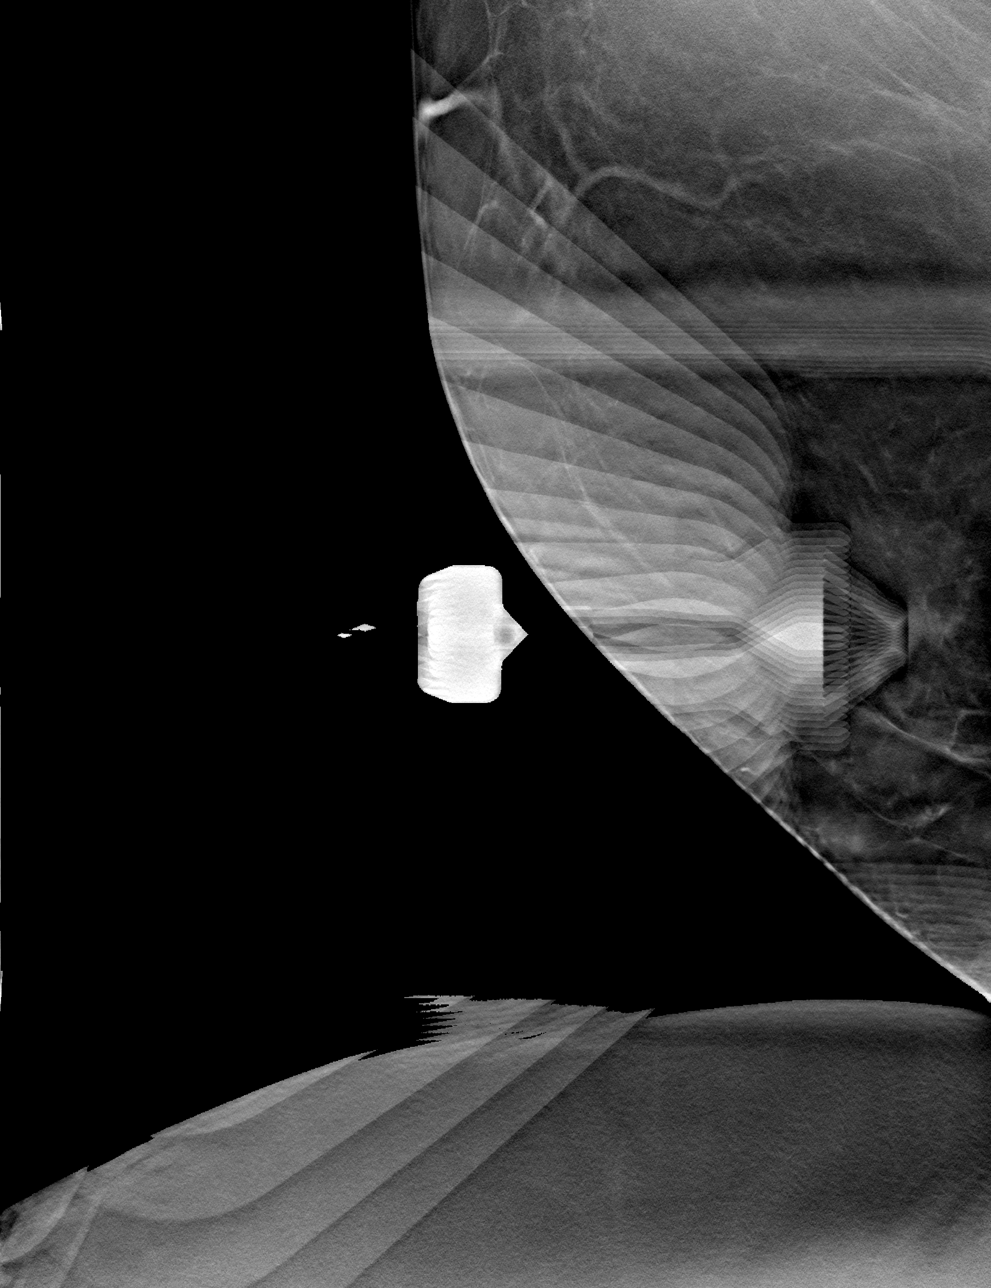

[4 of 16 positions shown; findings below may reference images not displayed]



Using sterile technique and 1% Lidocaine as local anesthetic, under
stereotactic guidance, a 9 gauge vacuum assisted device was used to
perform core needle biopsy of mass in the upper-outer quadrant right
breast using a lateral approach. Specimen radiograph was not
performed.

Lesion quadrant: Upper-outer quadrant

At the conclusion of the procedure, a coil tissue marker clip was
deployed into the biopsy cavity. Follow-up 2-view mammogram was
performed and dictated separately.
IMPRESSION: Stereotactic-guided biopsy of right breast. No apparent
complications.

## 2018-12-02 ENCOUNTER — Other Ambulatory Visit (HOSPITAL_COMMUNITY): Payer: Self-pay | Admitting: Family Medicine

## 2018-12-02 ENCOUNTER — Other Ambulatory Visit: Payer: Self-pay | Admitting: Family Medicine

## 2018-12-02 DIAGNOSIS — I10 Essential (primary) hypertension: Secondary | ICD-10-CM

## 2019-01-13 ENCOUNTER — Telehealth (HOSPITAL_COMMUNITY): Payer: Self-pay | Admitting: Radiology

## 2019-01-13 NOTE — Telephone Encounter (Signed)
Left message to call office-Patient needs to schedule an echocardiogram.  

## 2019-01-27 ENCOUNTER — Ambulatory Visit (HOSPITAL_COMMUNITY): Payer: BC Managed Care – PPO | Attending: Family Medicine

## 2019-01-27 ENCOUNTER — Other Ambulatory Visit: Payer: Self-pay

## 2019-01-27 DIAGNOSIS — I10 Essential (primary) hypertension: Secondary | ICD-10-CM | POA: Diagnosis present

## 2019-03-04 ENCOUNTER — Other Ambulatory Visit: Payer: Self-pay

## 2019-03-04 DIAGNOSIS — Z20822 Contact with and (suspected) exposure to covid-19: Secondary | ICD-10-CM

## 2019-03-05 LAB — NOVEL CORONAVIRUS, NAA: SARS-CoV-2, NAA: NOT DETECTED

## 2019-06-17 ENCOUNTER — Other Ambulatory Visit: Payer: Self-pay

## 2019-06-17 ENCOUNTER — Encounter: Payer: Self-pay | Admitting: Family

## 2019-06-17 ENCOUNTER — Ambulatory Visit (INDEPENDENT_AMBULATORY_CARE_PROVIDER_SITE_OTHER): Payer: BC Managed Care – PPO | Admitting: Family

## 2019-06-17 ENCOUNTER — Other Ambulatory Visit: Payer: Self-pay | Admitting: Family

## 2019-06-17 ENCOUNTER — Other Ambulatory Visit (INDEPENDENT_AMBULATORY_CARE_PROVIDER_SITE_OTHER): Payer: BC Managed Care – PPO

## 2019-06-17 VITALS — BP 130/72 | HR 68 | Temp 98.2°F | Ht 64.0 in | Wt 240.1 lb

## 2019-06-17 DIAGNOSIS — Z23 Encounter for immunization: Secondary | ICD-10-CM | POA: Diagnosis not present

## 2019-06-17 DIAGNOSIS — I10 Essential (primary) hypertension: Secondary | ICD-10-CM

## 2019-06-17 DIAGNOSIS — E7849 Other hyperlipidemia: Secondary | ICD-10-CM

## 2019-06-17 LAB — CBC WITH DIFFERENTIAL/PLATELET
Basophils Absolute: 0.1 10*3/uL (ref 0.0–0.1)
Basophils Relative: 0.7 % (ref 0.0–3.0)
Eosinophils Absolute: 0.1 10*3/uL (ref 0.0–0.7)
Eosinophils Relative: 1.3 % (ref 0.0–5.0)
HCT: 40.2 % (ref 36.0–46.0)
Hemoglobin: 13.6 g/dL (ref 12.0–15.0)
Lymphocytes Relative: 22.8 % (ref 12.0–46.0)
Lymphs Abs: 1.6 10*3/uL (ref 0.7–4.0)
MCHC: 33.7 g/dL (ref 30.0–36.0)
MCV: 91.9 fl (ref 78.0–100.0)
Monocytes Absolute: 0.8 10*3/uL (ref 0.1–1.0)
Monocytes Relative: 11.2 % (ref 3.0–12.0)
Neutro Abs: 4.6 10*3/uL (ref 1.4–7.7)
Neutrophils Relative %: 64 % (ref 43.0–77.0)
Platelets: 226 10*3/uL (ref 150.0–400.0)
RBC: 4.37 Mil/uL (ref 3.87–5.11)
RDW: 13.7 % (ref 11.5–15.5)
WBC: 7.2 10*3/uL (ref 4.0–10.5)

## 2019-06-17 LAB — LIPID PANEL
Cholesterol: 182 mg/dL (ref 0–200)
HDL: 75.8 mg/dL (ref >39.00)
LDL Cholesterol: 98 mg/dL (ref 0–99)
NonHDL: 106.66
Total CHOL/HDL Ratio: 2
Triglycerides: 44 mg/dL (ref 0.0–149.0)
VLDL: 8.8 mg/dL (ref 0.0–40.0)

## 2019-06-17 LAB — COMPREHENSIVE METABOLIC PANEL
ALT: 36 U/L — ABNORMAL HIGH (ref 0–35)
AST: 45 U/L — ABNORMAL HIGH (ref 0–37)
Albumin: 3.6 g/dL (ref 3.5–5.2)
Alkaline Phosphatase: 82 U/L (ref 39–117)
BUN: 16 mg/dL (ref 6–23)
CO2: 34 mEq/L — ABNORMAL HIGH (ref 19–32)
Calcium: 8.8 mg/dL (ref 8.4–10.5)
Chloride: 99 mEq/L (ref 96–112)
Creatinine, Ser: 0.59 mg/dL (ref 0.40–1.20)
GFR: 124.43 mL/min (ref >60.00)
Glucose, Bld: 92 mg/dL (ref 70–99)
Potassium: 3.3 mEq/L — ABNORMAL LOW (ref 3.5–5.1)
Sodium: 139 mEq/L (ref 135–145)
Total Bilirubin: 0.7 mg/dL (ref 0.2–1.2)
Total Protein: 7.5 g/dL (ref 6.0–8.3)

## 2019-06-17 LAB — MAGNESIUM: Magnesium: 1.6 mg/dL (ref 1.5–2.5)

## 2019-06-17 MED ORDER — POTASSIUM CHLORIDE CRYS ER 20 MEQ PO TBCR: 20.0000 meq | EXTENDED_RELEASE_TABLET | Freq: Every day | ORAL | 1 refills | Status: DC

## 2019-06-17 NOTE — Addendum Note (Signed)
Addended by: Marcina Millard on: 06/17/2019 02:00 PM   Modules accepted: Orders
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
## 2019-07-01 ENCOUNTER — Encounter: Payer: Self-pay | Admitting: Family

## 2019-07-01 NOTE — Progress Notes (Signed)
Outside notes received. Information abstracted. Notes sent to scan.  

## 2019-09-13 ENCOUNTER — Other Ambulatory Visit: Payer: Self-pay | Admitting: Family

## 2019-09-17 ENCOUNTER — Encounter: Payer: Self-pay | Admitting: Gastroenterology

## 2019-09-17 ENCOUNTER — Other Ambulatory Visit: Payer: Self-pay

## 2019-09-17 ENCOUNTER — Ambulatory Visit: Payer: BC Managed Care – PPO | Admitting: Family

## 2019-09-17 ENCOUNTER — Encounter: Payer: Self-pay | Admitting: Family

## 2019-09-17 ENCOUNTER — Ambulatory Visit (INDEPENDENT_AMBULATORY_CARE_PROVIDER_SITE_OTHER): Payer: BC Managed Care – PPO

## 2019-09-17 VITALS — BP 138/80 | HR 86 | Temp 98.5°F | Ht 64.0 in | Wt 245.2 lb

## 2019-09-17 DIAGNOSIS — E7849 Other hyperlipidemia: Secondary | ICD-10-CM

## 2019-09-17 DIAGNOSIS — M25561 Pain in right knee: Secondary | ICD-10-CM

## 2019-09-17 DIAGNOSIS — M25552 Pain in left hip: Secondary | ICD-10-CM | POA: Diagnosis not present

## 2019-09-17 DIAGNOSIS — R197 Diarrhea, unspecified: Secondary | ICD-10-CM | POA: Diagnosis not present

## 2019-09-17 DIAGNOSIS — E876 Hypokalemia: Secondary | ICD-10-CM

## 2019-09-17 DIAGNOSIS — I10 Essential (primary) hypertension: Secondary | ICD-10-CM

## 2019-09-17 LAB — COMPREHENSIVE METABOLIC PANEL
ALT: 31 U/L (ref 0–35)
AST: 35 U/L (ref 0–37)
Albumin: 3.7 g/dL (ref 3.5–5.2)
Alkaline Phosphatase: 72 U/L (ref 39–117)
BUN: 11 mg/dL (ref 6–23)
CO2: 31 mEq/L (ref 19–32)
Calcium: 9 mg/dL (ref 8.4–10.5)
Chloride: 101 mEq/L (ref 96–112)
Creatinine, Ser: 0.67 mg/dL (ref 0.40–1.20)
GFR: 107.36 mL/min (ref >60.00)
Glucose, Bld: 89 mg/dL (ref 70–99)
Potassium: 3.2 mEq/L — ABNORMAL LOW (ref 3.5–5.1)
Sodium: 138 mEq/L (ref 135–145)
Total Bilirubin: 0.8 mg/dL (ref 0.2–1.2)
Total Protein: 7.2 g/dL (ref 6.0–8.3)

## 2019-09-17 LAB — CBC WITH DIFFERENTIAL/PLATELET
Basophils Absolute: 0 10*3/uL (ref 0.0–0.1)
Basophils Relative: 0.6 % (ref 0.0–3.0)
Eosinophils Absolute: 0.1 10*3/uL (ref 0.0–0.7)
Eosinophils Relative: 1.7 % (ref 0.0–5.0)
HCT: 38.2 % (ref 36.0–46.0)
Hemoglobin: 12.9 g/dL (ref 12.0–15.0)
Lymphocytes Relative: 22.8 % (ref 12.0–46.0)
Lymphs Abs: 1.3 10*3/uL (ref 0.7–4.0)
MCHC: 33.7 g/dL (ref 30.0–36.0)
MCV: 91.3 fl (ref 78.0–100.0)
Monocytes Absolute: 0.7 10*3/uL (ref 0.1–1.0)
Monocytes Relative: 11.7 % (ref 3.0–12.0)
Neutro Abs: 3.6 10*3/uL (ref 1.4–7.7)
Neutrophils Relative %: 63.2 % (ref 43.0–77.0)
Platelets: 218 10*3/uL (ref 150.0–400.0)
RBC: 4.19 Mil/uL (ref 3.87–5.11)
RDW: 14.5 % (ref 11.5–15.5)
WBC: 5.8 10*3/uL (ref 4.0–10.5)

## 2019-09-17 LAB — AMYLASE: Amylase: 64 U/L (ref 27–131)

## 2019-09-17 LAB — LIPASE: Lipase: 7 U/L — ABNORMAL LOW (ref 11.0–59.0)
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
## 2019-09-20 ENCOUNTER — Ambulatory Visit: Payer: BC Managed Care – PPO | Attending: Internal Medicine

## 2019-09-20 DIAGNOSIS — Z23 Encounter for immunization: Secondary | ICD-10-CM | POA: Insufficient documentation

## 2019-09-20 NOTE — Progress Notes (Signed)
   Covid-19 Vaccination Clinic  Name:  [name] [name]    MRN: [id number] DOB: 10-25-1955  09/20/2019  [name] was observed post Covid-19 immunization for 15 minutes without incidence. She was provided with Vaccine Information Sheet and instruction to access the V-Safe system.   [name] was instructed to call 911 with any severe reactions post vaccine: Marland Kitchen Difficulty breathing  . Swelling of your face and throat  . A fast heartbeat  . A bad rash all over your body  . Dizziness and weakness    Immunizations Administered    Name Date Dose VIS Date Route   Pfizer COVID-19 Vaccine 09/20/2019  5:48 PM 0.3 mL 07/04/2019 Intramuscular   Manufacturer: [employer], [employer]   Lot: [id number]   NDC: [address]-3

## 2019-10-02 ENCOUNTER — Ambulatory Visit
Admission: RE | Admit: 2019-10-02 | Discharge: 2019-10-02 | Disposition: A | Discharge status: Home / Self Care | Payer: BC Managed Care – PPO | Source: Ambulatory Visit | Attending: Family | Admitting: Family

## 2019-10-02 DIAGNOSIS — R197 Diarrhea, unspecified: Secondary | ICD-10-CM

## 2019-10-02 MED ORDER — IOPAMIDOL (ISOVUE-300) INJECTION 61%
125.0000 mL | Freq: Once | INTRAVENOUS | Status: AC | PRN
  Administered 2019-10-02: 125 mL via INTRAVENOUS

## 2019-10-06 ENCOUNTER — Other Ambulatory Visit: Payer: Self-pay | Admitting: Family

## 2019-10-06 DIAGNOSIS — E876 Hypokalemia: Secondary | ICD-10-CM

## 2019-10-10 ENCOUNTER — Telehealth: Payer: Self-pay | Admitting: Family

## 2019-10-10 NOTE — Telephone Encounter (Signed)
New message:   Pt is calling and states she received a call about her potassium. Please advise.

## 2019-10-11 ENCOUNTER — Ambulatory Visit: Payer: BC Managed Care – PPO | Attending: Internal Medicine

## 2019-10-11 DIAGNOSIS — Z23 Encounter for immunization: Secondary | ICD-10-CM

## 2019-10-11 NOTE — Progress Notes (Signed)
   Covid-19 Vaccination Clinic  Name:  [name] [name]    MRN: [id number] DOB: 09/09/1955  10/11/2019  [name] was observed post Covid-19 immunization for 15 minutes without incident. She was provided with Vaccine Information Sheet and instruction to access the V-Safe system.   [name] was instructed to call 911 with any severe reactions post vaccine: Marland Kitchen Difficulty breathing  . Swelling of face and throat  . A fast heartbeat  . A bad rash all over body  . Dizziness and weakness   Immunizations Administered    Name Date Dose VIS Date Route   Pfizer COVID-19 Vaccine 10/11/2019  9:13 AM 0.3 mL 07/04/2019 Intramuscular   Manufacturer: [employer], [employer]   Lot: [id number]   NDC: [address]-7

## 2019-10-13 ENCOUNTER — Other Ambulatory Visit: Payer: Self-pay | Admitting: Family

## 2019-10-13 ENCOUNTER — Encounter: Payer: Self-pay | Admitting: Gastroenterology

## 2019-10-13 ENCOUNTER — Other Ambulatory Visit (INDEPENDENT_AMBULATORY_CARE_PROVIDER_SITE_OTHER): Payer: BC Managed Care – PPO

## 2019-10-13 ENCOUNTER — Other Ambulatory Visit: Payer: Self-pay

## 2019-10-13 ENCOUNTER — Ambulatory Visit: Payer: BC Managed Care – PPO | Admitting: Gastroenterology

## 2019-10-13 VITALS — BP 102/58 | HR 96 | Temp 98.7°F | Ht 64.0 in | Wt 247.1 lb

## 2019-10-13 DIAGNOSIS — E876 Hypokalemia: Secondary | ICD-10-CM | POA: Diagnosis not present

## 2019-10-13 DIAGNOSIS — Z86010 Personal history of colonic polyps: Secondary | ICD-10-CM

## 2019-10-13 DIAGNOSIS — K529 Noninfective gastroenteritis and colitis, unspecified: Secondary | ICD-10-CM

## 2019-10-13 LAB — BASIC METABOLIC PANEL
BUN: 12 mg/dL (ref 6–23)
CO2: 29 mEq/L (ref 19–32)
Calcium: 8.6 mg/dL (ref 8.4–10.5)
Chloride: 102 mEq/L (ref 96–112)
Creatinine, Ser: 0.63 mg/dL (ref 0.40–1.20)
GFR: 115.24 mL/min (ref >60.00)
Glucose, Bld: 124 mg/dL — ABNORMAL HIGH (ref 70–99)
Potassium: 3.2 mEq/L — ABNORMAL LOW (ref 3.5–5.1)
Sodium: 138 mEq/L (ref 135–145)

## 2019-10-13 MED ORDER — LOPERAMIDE HCL 2 MG PO TABS: 2.0000 mg | ORAL_TABLET | Freq: Every morning | ORAL | 0 refills | Status: DC

## 2019-10-13 MED ORDER — POTASSIUM CHLORIDE CRYS ER 20 MEQ PO TBCR: 40.0000 meq | EXTENDED_RELEASE_TABLET | Freq: Every day | ORAL | 1 refills | Status: DC
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
## 2019-10-13 NOTE — Patient Instructions (Addendum)
If you are age 64 or older, your body mass index should be between 23-30. Your Body mass index is 42.42 kg/m. If this is out of the aforementioned range listed, please consider follow up with your Primary Care Provider.  If you are age 7 or younger, your body mass index should be between 19-25. Your Body mass index is 42.42 kg/m. If this is out of the aformentioned range listed, please consider follow up with your Primary Care Provider.   Please purchase the following medications over the counter and take as directed: Imodium: Take 1 tablet every morning (Titrate as needed)  We will request records from your prior  (3) colonoscopies from [facility] - Dr. Bosie Clos.  Thank you for entrusting me with your care and for choosing [facility], Dr. Ileene Patrick

## 2019-10-15 ENCOUNTER — Telehealth: Payer: Self-pay | Admitting: Gastroenterology

## 2019-10-15 NOTE — Telephone Encounter (Signed)
Records received from prior colonoscopy  Dr. Bosie Clos - done 09/17/17 - fair prep that was lavaged and resulted in good visualization - 2 diminutive rectal polyps, hyperplastic, normal ileum, grade I hemorrhoids, otherwise normal.   Will see how she does with immodium after our clinic visit and she will await to hear back from her in a few weeks and see how she is doing.

## 2019-11-13 ENCOUNTER — Other Ambulatory Visit (INDEPENDENT_AMBULATORY_CARE_PROVIDER_SITE_OTHER): Payer: BC Managed Care – PPO

## 2019-11-13 ENCOUNTER — Other Ambulatory Visit: Payer: Self-pay

## 2019-11-13 DIAGNOSIS — E876 Hypokalemia: Secondary | ICD-10-CM

## 2019-11-13 LAB — BASIC METABOLIC PANEL
BUN: 12 mg/dL (ref 6–23)
CO2: 31 mEq/L (ref 19–32)
Calcium: 8.9 mg/dL (ref 8.4–10.5)
Chloride: 101 mEq/L (ref 96–112)
Creatinine, Ser: 0.64 mg/dL (ref 0.40–1.20)
GFR: 113.14 mL/min (ref >60.00)
Glucose, Bld: 91 mg/dL (ref 70–99)
Potassium: 3.6 mEq/L (ref 3.5–5.1)
Sodium: 139 mEq/L (ref 135–145)

## 2019-11-17 ENCOUNTER — Other Ambulatory Visit: Payer: Self-pay | Admitting: Family

## 2019-11-17 NOTE — Progress Notes (Signed)
Left message on patient's voicemail to notify that potassium had normalized; she is to continue taking 2 tablets daily and we will plan to see her in the office in 4-6 months; she is asked to call back if she has questions.

## 2019-11-18 ENCOUNTER — Telehealth: Payer: Self-pay

## 2019-11-18 NOTE — Telephone Encounter (Signed)
Per chart laura called pt w/lab results notified pt w/her response.Marland KitchenClance Boll, FNP  11/17/2019 11:12 AM EDT    Left message on patient's voicemail to notify that potassium had normalized; she is to continue taking 2 tablets daily and we will plan to see her in the office in 4-6 months; she is asked to call back if she has questions.

## 2019-11-18 NOTE — Telephone Encounter (Signed)
New message   The patient call stating someone call her today , returning call back to the CMA .

## 2019-11-24 ENCOUNTER — Telehealth: Payer: Self-pay | Admitting: Family

## 2019-11-24 NOTE — Telephone Encounter (Signed)
New message:   Pt is calling and states her medication of telmisartan-hydrochlorothiazide (MICARDIS HCT) 80-25 MG tablet is needing a prior authorization per the pharmacy. Please advise.

## 2019-11-26 ENCOUNTER — Other Ambulatory Visit: Payer: Self-pay | Admitting: Family

## 2019-11-26 NOTE — Telephone Encounter (Signed)
    Patient calling for status of authorization. She has no medication remaining. Pharmacy only provided patient 3 pills on last Friday.  Please advise

## 2019-12-10 ENCOUNTER — Other Ambulatory Visit: Payer: Self-pay | Admitting: Family

## 2020-02-28 ENCOUNTER — Other Ambulatory Visit: Payer: Self-pay | Admitting: Family

## 2020-05-08 ENCOUNTER — Other Ambulatory Visit: Payer: Self-pay | Admitting: Family

## 2020-05-28 ENCOUNTER — Encounter: Payer: Self-pay | Admitting: Family

## 2020-05-28 ENCOUNTER — Other Ambulatory Visit: Payer: Self-pay

## 2020-05-28 ENCOUNTER — Ambulatory Visit (INDEPENDENT_AMBULATORY_CARE_PROVIDER_SITE_OTHER): Payer: BC Managed Care – PPO | Admitting: Family

## 2020-05-28 VITALS — BP 138/78 | HR 75 | Temp 98.2°F | Ht 64.0 in | Wt 249.0 lb

## 2020-05-28 DIAGNOSIS — E785 Hyperlipidemia, unspecified: Secondary | ICD-10-CM | POA: Diagnosis not present

## 2020-05-28 DIAGNOSIS — E559 Vitamin D deficiency, unspecified: Secondary | ICD-10-CM

## 2020-05-28 DIAGNOSIS — Z1159 Encounter for screening for other viral diseases: Secondary | ICD-10-CM

## 2020-05-28 DIAGNOSIS — R197 Diarrhea, unspecified: Secondary | ICD-10-CM

## 2020-05-28 DIAGNOSIS — Z23 Encounter for immunization: Secondary | ICD-10-CM | POA: Diagnosis not present

## 2020-05-28 DIAGNOSIS — Z0000 Encounter for general adult medical examination without abnormal findings: Secondary | ICD-10-CM | POA: Diagnosis not present

## 2020-05-28 LAB — LIPID PANEL
Cholesterol: 166 mg/dL (ref 0–200)
HDL: 75.4 mg/dL (ref >39.00)
LDL Cholesterol: 82 mg/dL (ref 0–99)
NonHDL: 90.63
Total CHOL/HDL Ratio: 2
Triglycerides: 44 mg/dL (ref 0.0–149.0)
VLDL: 8.8 mg/dL (ref 0.0–40.0)

## 2020-05-28 LAB — CBC WITH DIFFERENTIAL/PLATELET
Basophils Absolute: 0 10*3/uL (ref 0.0–0.1)
Basophils Relative: 0.5 % (ref 0.0–3.0)
Eosinophils Absolute: 0.1 10*3/uL (ref 0.0–0.7)
Eosinophils Relative: 0.7 % (ref 0.0–5.0)
HCT: 41 % (ref 36.0–46.0)
Hemoglobin: 13.4 g/dL (ref 12.0–15.0)
Lymphocytes Relative: 18.6 % (ref 12.0–46.0)
Lymphs Abs: 1.4 10*3/uL (ref 0.7–4.0)
MCHC: 32.8 g/dL (ref 30.0–36.0)
MCV: 92.6 fl (ref 78.0–100.0)
Monocytes Absolute: 0.6 10*3/uL (ref 0.1–1.0)
Monocytes Relative: 8.7 % (ref 3.0–12.0)
Neutro Abs: 5.2 10*3/uL (ref 1.4–7.7)
Neutrophils Relative %: 71.5 % (ref 43.0–77.0)
Platelets: 220 10*3/uL (ref 150.0–400.0)
RBC: 4.43 Mil/uL (ref 3.87–5.11)
RDW: 13.8 % (ref 11.5–15.5)
WBC: 7.3 10*3/uL (ref 4.0–10.5)

## 2020-05-28 LAB — COMPREHENSIVE METABOLIC PANEL
ALT: 30 U/L (ref 0–35)
AST: 36 U/L (ref 0–37)
Albumin: 3.8 g/dL (ref 3.5–5.2)
Alkaline Phosphatase: 76 U/L (ref 39–117)
BUN: 10 mg/dL (ref 6–23)
CO2: 32 mEq/L (ref 19–32)
Calcium: 9 mg/dL (ref 8.4–10.5)
Chloride: 101 mEq/L (ref 96–112)
Creatinine, Ser: 0.65 mg/dL (ref 0.40–1.20)
GFR: 93.14 mL/min (ref >60.00)
Glucose, Bld: 98 mg/dL (ref 70–99)
Potassium: 3 mEq/L — ABNORMAL LOW (ref 3.5–5.1)
Sodium: 141 mEq/L (ref 135–145)
Total Bilirubin: 0.7 mg/dL (ref 0.2–1.2)
Total Protein: 7.4 g/dL (ref 6.0–8.3)

## 2020-05-28 LAB — VITAMIN D 25 HYDROXY (VIT D DEFICIENCY, FRACTURES): VITD: 15.45 ng/mL — ABNORMAL LOW (ref 30.00–100.00)

## 2020-05-28 LAB — LIPASE: Lipase: 5 U/L — ABNORMAL LOW (ref 11.0–59.0)

## 2020-05-28 LAB — AMYLASE: Amylase: 73 U/L (ref 27–131)
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
## 2020-05-28 NOTE — Addendum Note (Signed)
Addended by: Casper Harrison on: 05/28/2020 02:09 PM   Modules accepted: Orders

## 2020-05-28 NOTE — Patient Instructions (Signed)
Please call Dr. Adela Lank at [facility] to discuss diarrhea [phone];

## 2020-05-31 ENCOUNTER — Other Ambulatory Visit: Payer: Self-pay | Admitting: Family

## 2020-05-31 LAB — HEPATITIS C ANTIBODY
Hepatitis C Ab: NONREACTIVE
SIGNAL TO CUT-OFF: 0.02 (ref <1.00)

## 2020-05-31 MED ORDER — VITAMIN D (ERGOCALCIFEROL) 1.25 MG (50000 UNIT) PO CAPS: 50000.0000 [IU] | ORAL_CAPSULE | ORAL | 0 refills | Status: AC

## 2020-07-01 ENCOUNTER — Telehealth: Payer: Self-pay

## 2020-07-01 ENCOUNTER — Telehealth: Payer: Self-pay | Admitting: Family

## 2020-07-01 DIAGNOSIS — E876 Hypokalemia: Secondary | ICD-10-CM

## 2020-07-01 NOTE — Telephone Encounter (Signed)
Please go back over the result note with her from the labs dated 05/28/2020. The biggest concern was her potassium. I need to know how she is taking the supplement. Please send me her response so I can adjust her prescription accordingly. We will need to get her potassium level re-checked in 1 week.  Vitamin D was sent to the pharmacy for her.  She was supposed to call her GI to schedule a follow up.

## 2020-07-01 NOTE — Telephone Encounter (Signed)
Patient called back and results given a second time. She wanted to know what medications she needed to pick up and Her hep results was given.

## 2020-07-01 NOTE — Telephone Encounter (Signed)
Left patient a voicemail to to return office call in regards to her call about lab results.

## 2020-07-01 NOTE — Telephone Encounter (Signed)
    Patient requesting a call to discuss lab results from 11/5

## 2020-07-02 NOTE — Telephone Encounter (Signed)
Called patient back she stated she has not picked up her Vit D and is not able to take her potassium meds twice daily. She has an upcoming appointment with GI in January.

## 2020-07-05 NOTE — Telephone Encounter (Signed)
She should take her potassium at the same time then- she needs to take 40 meq daily. She could have some heart complications if we can't get her potassium level right. Please take 2 at the same time and get her level re-checked in 1 week; she can go to [facility] without appointment.

## 2020-07-05 NOTE — Telephone Encounter (Signed)
Patient states she is now taking medication correctly and will go to [facility] the 17th.

## 2020-07-08 ENCOUNTER — Other Ambulatory Visit: Payer: BC Managed Care – PPO

## 2020-07-12 ENCOUNTER — Other Ambulatory Visit (INDEPENDENT_AMBULATORY_CARE_PROVIDER_SITE_OTHER): Payer: BC Managed Care – PPO

## 2020-07-12 DIAGNOSIS — E876 Hypokalemia: Secondary | ICD-10-CM

## 2020-07-12 LAB — BASIC METABOLIC PANEL
BUN: 10 mg/dL (ref 6–23)
CO2: 31 mEq/L (ref 19–32)
Calcium: 8.5 mg/dL (ref 8.4–10.5)
Chloride: 104 mEq/L (ref 96–112)
Creatinine, Ser: 0.67 mg/dL (ref 0.40–1.20)
GFR: 92.38 mL/min (ref >60.00)
Glucose, Bld: 123 mg/dL — ABNORMAL HIGH (ref 70–99)
Potassium: 3.1 mEq/L — ABNORMAL LOW (ref 3.5–5.1)
Sodium: 141 mEq/L (ref 135–145)

## 2020-07-26 ENCOUNTER — Other Ambulatory Visit: Payer: Self-pay | Admitting: Family

## 2020-08-02 ENCOUNTER — Other Ambulatory Visit (INDEPENDENT_AMBULATORY_CARE_PROVIDER_SITE_OTHER): Payer: BC Managed Care – PPO

## 2020-08-02 ENCOUNTER — Ambulatory Visit: Payer: BC Managed Care – PPO | Admitting: Gastroenterology

## 2020-08-02 ENCOUNTER — Encounter: Payer: Self-pay | Admitting: Gastroenterology

## 2020-08-02 VITALS — BP 120/68 | HR 73 | Ht 64.0 in | Wt 258.0 lb

## 2020-08-02 DIAGNOSIS — I10 Essential (primary) hypertension: Secondary | ICD-10-CM | POA: Diagnosis not present

## 2020-08-02 DIAGNOSIS — K529 Noninfective gastroenteritis and colitis, unspecified: Secondary | ICD-10-CM | POA: Diagnosis not present

## 2020-08-02 LAB — TSH: TSH: 1.73 u[IU]/mL (ref 0.35–4.50)

## 2020-08-02 MED ORDER — LOPERAMIDE HCL 2 MG PO TABS: 2.0000 mg | ORAL_TABLET | Freq: Every day | ORAL | 0 refills | Status: DC

## 2020-08-02 NOTE — Patient Instructions (Addendum)
If you are age 65 or older, your body mass index should be between 23-30. Your Body mass index is 44.29 kg/m. If this is out of the aforementioned range listed, please consider follow up with your Primary Care Provider.  If you are age 78 or younger, your body mass index should be between 19-25. Your Body mass index is 44.29 kg/m. If this is out of the aformentioned range listed, please consider follow up with your Primary Care Provider.   Please go to the lab in the basement of our building to have lab work done as you leave today. Hit "B" for basement when you get on the elevator.  When the doors open the lab is on your left.  We will call you with the results. Thank you.  Due to recent changes in healthcare laws, you may see the results of your imaging and laboratory studies on MyChart before your provider has had a chance to review them.  We understand that in some cases there may be results that are confusing or concerning to you. Not all laboratory results come back in the same time frame and the provider may be waiting for multiple results in order to interpret others.  Please give [place] 48 hours in order for your provider to thoroughly review all the results before contacting the office for clarification of your results.    We are giving you a Low FOD-MAP diet to follow.  Take Imodium once daily. You can increase or decrease as needed.  We will request your stool study records from Dr. Marcy Siren office.  Thank you for entrusting me with your care and for choosing [facility], Dr. Ileene Patrick
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
## 2020-08-10 ENCOUNTER — Telehealth: Payer: Self-pay | Admitting: Gastroenterology

## 2020-08-10 NOTE — Telephone Encounter (Signed)
Records from PCP arrived.  Patient had negative C Diff in 2020 as well as negative stool culture. No other stool testing done.  Will await fecal lactoferrin which is pending and asked her to take immodium in the interim, per clinic visit note.

## 2020-08-12 ENCOUNTER — Other Ambulatory Visit: Payer: Self-pay | Admitting: Family

## 2020-08-17 ENCOUNTER — Other Ambulatory Visit: Payer: BC Managed Care – PPO

## 2020-08-17 DIAGNOSIS — K529 Noninfective gastroenteritis and colitis, unspecified: Secondary | ICD-10-CM

## 2020-08-18 LAB — FECAL LACTOFERRIN, QUANT
Fecal Lactoferrin: POSITIVE — AB
MICRO NUMBER:: 11454332
SPECIMEN QUALITY:: ADEQUATE

## 2020-08-20 ENCOUNTER — Telehealth: Payer: Self-pay | Admitting: Family

## 2020-08-20 MED ORDER — TELMISARTAN-HCTZ 80-25 MG PO TABS: 1.0000 | ORAL_TABLET | Freq: Every day | ORAL | 2 refills | Status: DC

## 2020-08-20 NOTE — Telephone Encounter (Signed)
1.Medication Requested: telmisartan-hydrochlorothiazide (MICARDIS HCT) 80-25 MG tablet    2. Pharmacy (Name, Street, [place]): [facility] [phone] - [place], [place] - [address] AT [address]  3. On Med List: yes   4. Last Visit with PCP: 11.5.21  5. Next visit date with PCP: n/a   Agent: Please be advised that RX refills may take up to 3 business days. We ask that you follow-up with your pharmacy.

## 2020-08-20 NOTE — Telephone Encounter (Signed)
Reviewed chart pt is up-to-date sent refills to [facility].../lmb  

## 2020-11-05 ENCOUNTER — Other Ambulatory Visit: Payer: Self-pay | Admitting: Family

## 2021-02-11 ENCOUNTER — Other Ambulatory Visit: Payer: Self-pay | Admitting: Family

## 2021-02-11 MED ORDER — ROSUVASTATIN CALCIUM 5 MG PO TABS: 5.0000 mg | ORAL_TABLET | Freq: Every day | ORAL | 0 refills | Status: DC

## 2021-02-11 NOTE — Telephone Encounter (Signed)
I have called the pt back and she stated that she is going to stay at [facility] to continue her care. We have given her a 90 day curtsy refill.

## 2021-03-15 ENCOUNTER — Other Ambulatory Visit: Payer: Self-pay | Admitting: Family

## 2021-03-18 MED ORDER — POTASSIUM CHLORIDE CRYS ER 20 MEQ PO TBCR: EXTENDED_RELEASE_TABLET | ORAL | 0 refills | Status: DC

## 2021-03-18 NOTE — Telephone Encounter (Signed)
Patient is scheduled for TOC to Dr. Jonny Ruiz on 04/07/2021. Requesting a limited supply.   Please advise.

## 2021-03-18 NOTE — Telephone Encounter (Signed)
Pt is asking for temp refill of Potassium. I have pended the order for provider sig if appropriate

## 2021-04-07 ENCOUNTER — Ambulatory Visit: Payer: BC Managed Care – PPO | Admitting: Internal Medicine

## 2021-04-07 ENCOUNTER — Encounter: Payer: Self-pay | Admitting: Internal Medicine

## 2021-04-07 ENCOUNTER — Other Ambulatory Visit: Payer: Self-pay

## 2021-04-07 VITALS — BP 126/70 | HR 78 | Temp 98.6°F | Ht 64.0 in | Wt 224.0 lb

## 2021-04-07 DIAGNOSIS — E538 Deficiency of other specified B group vitamins: Secondary | ICD-10-CM | POA: Diagnosis not present

## 2021-04-07 DIAGNOSIS — I10 Essential (primary) hypertension: Secondary | ICD-10-CM

## 2021-04-07 DIAGNOSIS — Z0001 Encounter for general adult medical examination with abnormal findings: Secondary | ICD-10-CM | POA: Diagnosis not present

## 2021-04-07 DIAGNOSIS — M25552 Pain in left hip: Secondary | ICD-10-CM

## 2021-04-07 DIAGNOSIS — M25562 Pain in left knee: Secondary | ICD-10-CM | POA: Diagnosis not present

## 2021-04-07 DIAGNOSIS — E2839 Other primary ovarian failure: Secondary | ICD-10-CM | POA: Diagnosis not present

## 2021-04-07 DIAGNOSIS — Z23 Encounter for immunization: Secondary | ICD-10-CM

## 2021-04-07 DIAGNOSIS — Z114 Encounter for screening for human immunodeficiency virus [HIV]: Secondary | ICD-10-CM | POA: Diagnosis not present

## 2021-04-07 DIAGNOSIS — E7800 Pure hypercholesterolemia, unspecified: Secondary | ICD-10-CM

## 2021-04-07 DIAGNOSIS — E876 Hypokalemia: Secondary | ICD-10-CM

## 2021-04-07 DIAGNOSIS — R739 Hyperglycemia, unspecified: Secondary | ICD-10-CM

## 2021-04-07 DIAGNOSIS — F17200 Nicotine dependence, unspecified, uncomplicated: Secondary | ICD-10-CM | POA: Insufficient documentation

## 2021-04-07 DIAGNOSIS — E559 Vitamin D deficiency, unspecified: Secondary | ICD-10-CM

## 2021-04-07 DIAGNOSIS — G8929 Other chronic pain: Secondary | ICD-10-CM

## 2021-04-07 DIAGNOSIS — K529 Noninfective gastroenteritis and colitis, unspecified: Secondary | ICD-10-CM | POA: Insufficient documentation

## 2021-04-07 HISTORY — DX: Hyperglycemia, unspecified: R73.9

## 2021-04-07 LAB — URINALYSIS, ROUTINE W REFLEX MICROSCOPIC
Bilirubin Urine: NEGATIVE
Hgb urine dipstick: NEGATIVE
Ketones, ur: NEGATIVE
Leukocytes,Ua: NEGATIVE
Nitrite: NEGATIVE
RBC / HPF: NONE SEEN (ref 0–?)
Renal Epithel, UA: NONE SEEN
Specific Gravity, Urine: 1.025 (ref 1.000–1.030)
Total Protein, Urine: NEGATIVE
Urine Glucose: NEGATIVE
Urobilinogen, UA: 0.2 (ref 0.0–1.0)
pH: 6 (ref 5.0–8.0)

## 2021-04-07 LAB — CBC WITH DIFFERENTIAL/PLATELET
Basophils Absolute: 0 10*3/uL (ref 0.0–0.1)
Basophils Relative: 0.6 % (ref 0.0–3.0)
Eosinophils Absolute: 0.1 10*3/uL (ref 0.0–0.7)
Eosinophils Relative: 1.1 % (ref 0.0–5.0)
HCT: 38.2 % (ref 36.0–46.0)
Hemoglobin: 12.9 g/dL (ref 12.0–15.0)
Lymphocytes Relative: 24.9 % (ref 12.0–46.0)
Lymphs Abs: 1.5 10*3/uL (ref 0.7–4.0)
MCHC: 33.7 g/dL (ref 30.0–36.0)
MCV: 91.9 fl (ref 78.0–100.0)
Monocytes Absolute: 0.5 10*3/uL (ref 0.1–1.0)
Monocytes Relative: 9.2 % (ref 3.0–12.0)
Neutro Abs: 3.8 10*3/uL (ref 1.4–7.7)
Neutrophils Relative %: 64.2 % (ref 43.0–77.0)
Platelets: 218 10*3/uL (ref 150.0–400.0)
RBC: 4.16 Mil/uL (ref 3.87–5.11)
RDW: 14 % (ref 11.5–15.5)
WBC: 5.9 10*3/uL (ref 4.0–10.5)

## 2021-04-07 LAB — HEPATIC FUNCTION PANEL
ALT: 12 U/L (ref 0–35)
AST: 19 U/L (ref 0–37)
Albumin: 3.6 g/dL (ref 3.5–5.2)
Alkaline Phosphatase: 70 U/L (ref 39–117)
Bilirubin, Direct: 0.2 mg/dL (ref 0.0–0.3)
Total Bilirubin: 0.7 mg/dL (ref 0.2–1.2)
Total Protein: 7.1 g/dL (ref 6.0–8.3)

## 2021-04-07 LAB — LIPID PANEL
Cholesterol: 173 mg/dL (ref 0–200)
HDL: 68.4 mg/dL (ref >39.00)
LDL Cholesterol: 94 mg/dL (ref 0–99)
NonHDL: 104.71
Total CHOL/HDL Ratio: 3
Triglycerides: 56 mg/dL (ref 0.0–149.0)
VLDL: 11.2 mg/dL (ref 0.0–40.0)

## 2021-04-07 LAB — BASIC METABOLIC PANEL
BUN: 6 mg/dL (ref 6–23)
CO2: 33 mEq/L — ABNORMAL HIGH (ref 19–32)
Calcium: 8.9 mg/dL (ref 8.4–10.5)
Chloride: 101 mEq/L (ref 96–112)
Creatinine, Ser: 0.66 mg/dL (ref 0.40–1.20)
GFR: 92.24 mL/min (ref >60.00)
Glucose, Bld: 87 mg/dL (ref 70–99)
Potassium: 3.1 mEq/L — ABNORMAL LOW (ref 3.5–5.1)
Sodium: 140 mEq/L (ref 135–145)

## 2021-04-07 LAB — VITAMIN B12: Vitamin B-12: 251 pg/mL (ref 211–911)

## 2021-04-07 LAB — VITAMIN D 25 HYDROXY (VIT D DEFICIENCY, FRACTURES): VITD: 14.3 ng/mL — ABNORMAL LOW (ref 30.00–100.00)

## 2021-04-07 LAB — HEMOGLOBIN A1C: Hgb A1c MFr Bld: 5.5 % (ref 4.6–6.5)

## 2021-04-07 LAB — TSH: TSH: 1.63 u[IU]/mL (ref 0.35–5.50)

## 2021-04-07 NOTE — Patient Instructions (Addendum)
You will be contacted regarding the referral for: sports medicine  You had the flu shot today  Please consider the Novavax covid vaccine next month at the pharmacy when available  Please make Nurse appt to have the Prevnar 20 pneumonia shot in 2 weeks  You will be contacted regarding the referral for: sports medicine on the first floor for the left knee and hip pain  Please schedule the bone density test before leaving today at the scheduling desk (where you check out)  Please continue all other medications as before, and refills have been done if requested.  Please have the pharmacy call with any other refills you may need.  Please continue your efforts at being more active, low cholesterol diet, and weight control.  You are otherwise up to date with prevention measures today.  Please keep your appointments with your specialists as you may have planned  Please go to the LAB at the blood drawing area for the tests to be done  You will be contacted by phone if any changes need to be made immediately.  Otherwise, you will receive a letter about your results with an explanation, but please check with MyChart first.  Please remember to sign up for MyChart if you have not done so, as this will be important to you in the future with finding out test results, communicating by private email, and scheduling acute appointments online when needed.  Please make an Appointment to return in 6 months, or sooner if needed
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
## 2021-04-08 LAB — HIV ANTIBODY (ROUTINE TESTING W REFLEX): HIV 1&2 Ab, 4th Generation: NONREACTIVE

## 2021-04-08 NOTE — Progress Notes (Signed)
Subjective:    CC: L knee and L hip pain  I, Molly Weber, LAT, ATC, am serving as scribe for Dr. Clementeen Graham.  HPI: Pt is a 65 y/o female presenting w/ c/o chronic L hip, thigh pain x one year that is progressively worsening.  She locates her pain to her L ant hip radiating into her L ant thigh to her L ant knee.  Radiating pain: yes into the L thigh Low back pain: No Mechanical symptoms: No Aggravating factors: worse at night; laying on her L side; prolonged sitting or standing; prolonged walking Treatments tried: topical pain relieving cream  Diagnostic testing: L hip XR- 09/17/19   Pertinent review of Systems: No fevers or chills  Relevant historical information: Hypertension.   Objective:    Vitals:   04/11/21 1028  BP: 110/64  Pulse: 77  SpO2: 97%   General: Well Developed, well nourished, and in no acute distress.   MSK: L-spine: Nontender midline.  Normal lumbar motion. Lower extremity strength intact except noted below.  Knee extension flexion foot dorsiflexion and plantar flexion intact bilaterally. Left hip normal-appearing Tender palpation greater trochanter.  Normal hip motion. Hip abduction strength diminished 3+/5.  External rotation strength diminished 4/5.  Hip flexion strength intact. Pulses cap refill and sensation are intact distally.  Lab and Radiology Results  X-ray images left hip and L-spine obtained today personally and independently interpreted  Left hip: Mild DJD.  Mild potential for femoral acetabular impingement based on appearance.  L-spine: No acute fractures.  No severe degenerative changes..  Await formal radiology review    Impression and Recommendations:    Assessment and Plan: 65 y.o. female with left hip pain.  Patient does have some anterior hip pain but the majority of her pain on exam today seems to be lateral greater trochanter.  Additionally she has significant weakness to hip abduction and external rotation with  pain.  Plan to treat with physical therapy to focus on hip abduction strengthening.  Recheck in 1 month.  If not improving next step would be potential injection or MRI.Marland Kitchen  PDMP not reviewed this encounter. Orders Placed This Encounter  Procedures   DG HIP UNILAT WITH PELVIS 2-3 VIEWS LEFT    Standing Status:   Future    Number of Occurrences:   1    Standing Expiration Date:   04/11/2022    Order Specific Question:   Reason for Exam (SYMPTOM  OR DIAGNOSIS REQUIRED)    Answer:   eval hip pain    Order Specific Question:   Preferred imaging location?    Answer:   [facility]   DG Lumbar Spine 2-3 Views    Standing Status:   Future    Number of Occurrences:   1    Standing Expiration Date:   04/11/2022    Order Specific Question:   Reason for Exam (SYMPTOM  OR DIAGNOSIS REQUIRED)    Answer:   eval pos L3 rad left    Order Specific Question:   Preferred imaging location?    Answer:   [facility]   Ambulatory referral to Physical Therapy    Referral Priority:   Routine    Referral Type:   Physical Medicine    Referral Reason:   Specialty Services Required    Requested Specialty:   Physical Therapy    Number of Visits Requested:   1   No orders of the defined types were placed in this encounter.  Discussed warning signs or symptoms. Please see discharge instructions. Patient expresses understanding.   The above documentation has been reviewed and is accurate and complete Lynne Leader, M.D.

## 2021-04-11 ENCOUNTER — Ambulatory Visit (INDEPENDENT_AMBULATORY_CARE_PROVIDER_SITE_OTHER): Payer: BC Managed Care – PPO | Admitting: Family Medicine

## 2021-04-11 ENCOUNTER — Other Ambulatory Visit: Payer: Self-pay

## 2021-04-11 ENCOUNTER — Encounter: Payer: Self-pay | Admitting: Family Medicine

## 2021-04-11 ENCOUNTER — Ambulatory Visit (INDEPENDENT_AMBULATORY_CARE_PROVIDER_SITE_OTHER): Payer: BC Managed Care – PPO

## 2021-04-11 VITALS — BP 110/64 | HR 77 | Ht 64.0 in | Wt 222.0 lb

## 2021-04-11 DIAGNOSIS — M76892 Other specified enthesopathies of left lower limb, excluding foot: Secondary | ICD-10-CM

## 2021-04-11 DIAGNOSIS — M7062 Trochanteric bursitis, left hip: Secondary | ICD-10-CM

## 2021-04-11 DIAGNOSIS — M79605 Pain in left leg: Secondary | ICD-10-CM | POA: Diagnosis not present

## 2021-04-11 NOTE — Patient Instructions (Addendum)
Thank you for coming in today.   Please get an Xray today before you leave   I've referred you to Physical Therapy.  Let us know if you don't hear from them in one week.   Recheck with me in 1 month.   Let me know sooner than 1 month if things are not going well.   Hip Bursitis Hip bursitis is swelling of one or more fluid-filled sacs (bursae) in your hip joint. This condition can cause pain, and your symptoms may come and go over time. What are the causes? Repeated use of your hip muscles. Injury to the hip. Weak butt muscles. Bone spurs. Infection. In some cases, the cause may not be known. What increases the risk? You are more likely to develop this condition if: You had a past hip injury or hip surgery. You have a condition, such as arthritis, gout, diabetes, or thyroid disease. You have spine problems. You have one leg that is shorter than the other. You run a lot or do long-distance running. You play sports where there is a risk of injury or falling, such as football, martial arts, or skiing. What are the signs or symptoms? Symptoms may come and go, and they often include: Pain in the hip or groin area. Pain may get worse when you move your hip. Tenderness and swelling of the hip. In rare cases, the bursa may become infected. If this happens, you may get a fever, as well as have warmth and redness in the hip area. How is this treated? This condition is treated by: Resting your hip. Icing your hip. Wrapping the hip area with an elastic bandage (compression wrap). Keeping the hip raised. Other treatments may include medicine, draining fluid out of the bursa, or using crutches, a cane, or a walker. Surgery may be needed, but this is rare. Long-term treatment may include doing exercises to help your strength and flexibility. It may also include lifestyle changes like losing weight to lessen the strain on your hip. Follow these instructions at home: Managing pain, stiffness,  and swelling   If told, put ice on the painful area. Put ice in a plastic bag. Place a towel between your skin and the bag. Leave the ice on for 20 minutes, 2-3 times a day. Raise your hip by putting a pillow under your hips while you lie down. Stop if you feel pain. If told, put heat on the affected area. Do this as often as told by your doctor. Use a moist heat pack or a heating pad as told by your doctor. Place a towel between your skin and the heat source. Leave the heat on for 20-30 minutes. Take off the heat if your skin turns bright red. This is very important if you are unable to feel pain, heat, or cold. You may have a greater risk of getting burned. Activity Do not use your hip to support your body weight until your doctor says that you can. Use crutches, a cane, or a walker as told by your doctor. If the affected leg is one that you use to drive, ask your doctor if it is safe to drive. Rest and protect your hip as much as you can until you feel better. Return to your normal activities as told by your doctor. Ask your doctor what activities are safe for you. Do exercises as told by your doctor. General instructions Take over-the-counter and prescription medicines only as told by your doctor. Gently rub and stretch your  injured area as often as is comfortable. Wear elastic bandages only as told by your doctor. If one of your legs is shorter than the other, get fitted for a shoe insert or orthotic. Keep a healthy weight. Follow instructions from your doctor. Keep all follow-up visits as told by your doctor. This is important. How is this prevented? Exercise regularly, as told by your doctor. Wear the right shoes for the sport you play. Warm up and stretch before being active. Cool down and stretch after being active. Take breaks often from repeated activity. Avoid activities that bother your hip or cause pain. Avoid sitting down for a long time. Where to find more  information American Academy of Orthopa[employer]geons: orthoinfo.aaos.org Contact a doctor if: You have a fever. You have new symptoms. You have trouble walking or doing everyday activities. You have pain that gets worse or does not get better with medicine. Your skin around your hip is red. You get a feeling of warmth in your hip area. Get help right away if: You cannot move your hip. You have very bad pain. You cannot control the muscles in your feet. Summary Hip bursitis is swelling of one or more fluid-filled sacs (bursae) in your hip joint. Symptoms often come and go over time. This condition is often treated by resting and icing the hip. It also may help to keep the area raised and wrapped in an elastic bandage. Other treatments may be needed. This information is not intended to replace advice given to you by your health care provider. Make sure you discuss any questions you have with your health care provider. Document Revised: 05/12/2019 Document Reviewed: 03/18/2018 Elsevier Patient Education  2022 [employer].

## 2021-04-12 NOTE — Progress Notes (Signed)
Lumbar spine x-ray shows no acute fracture but some evidence of mild chronic compression deformities previously.  No new changes seen in the spine.

## 2021-04-12 NOTE — Progress Notes (Signed)
Left hip x-ray shows some mild arthritis

## 2021-04-13 ENCOUNTER — Encounter: Payer: Self-pay | Admitting: Internal Medicine

## 2021-04-13 DIAGNOSIS — E785 Hyperlipidemia, unspecified: Secondary | ICD-10-CM | POA: Insufficient documentation

## 2021-04-13 NOTE — Assessment & Plan Note (Signed)
Worsening, Pt for volt gel prn, refer sport medicine 

## 2021-04-13 NOTE — Assessment & Plan Note (Signed)
BP Readings from Last 3 Encounters:  04/11/21 110/64  04/07/21 126/70  08/02/20 120/68   Stable, pt to continue medical treatment micardis hct

## 2021-04-13 NOTE — Assessment & Plan Note (Addendum)
Age and sex appropriate education and counseling updated with regular exercise and diet Referrals for preventative services - for dxa scan Immunizations addressed - declines covid booster and shingrix Smoking counseling  - counseled to quit, pt not ready Evidence for depression or other mood disorder - none significant Most recent labs reviewed. I have personally reviewed and have noted: 1) the patient's medical and social history 2) The patient's current medications and supplements 3) The patient's height, weight, and BMI have been recorded in the chart

## 2021-04-13 NOTE — Assessment & Plan Note (Signed)
Lab Results  Component Value Date   HGBA1C 5.5 04/07/2021   Stable, pt to continue current medical treatment  - diet

## 2021-04-13 NOTE — Assessment & Plan Note (Signed)
Also for f/u lab,  to f/u any worsening symptoms or concerns 

## 2021-04-13 NOTE — Assessment & Plan Note (Signed)
Lab Results  Component Value Date   LDLCALC 94 04/07/2021   Stable, pt to continue current statin crestor

## 2021-04-13 NOTE — Assessment & Plan Note (Signed)
counsled to quit, pt not ready 

## 2021-04-13 NOTE — Assessment & Plan Note (Signed)
For dxa scan

## 2021-04-13 NOTE — Assessment & Plan Note (Signed)
Worsening, Pt for volt gel prn, refer sport medicine

## 2021-04-20 ENCOUNTER — Other Ambulatory Visit: Payer: Self-pay | Admitting: Family

## 2021-04-20 ENCOUNTER — Telehealth: Payer: Self-pay | Admitting: Internal Medicine

## 2021-04-20 NOTE — Telephone Encounter (Signed)
    Please call patient to discuss lab results 

## 2021-04-21 ENCOUNTER — Telehealth: Payer: Self-pay | Admitting: Internal Medicine

## 2021-04-21 MED ORDER — POTASSIUM CHLORIDE CRYS ER 20 MEQ PO TBCR: EXTENDED_RELEASE_TABLET | ORAL | 3 refills | Status: DC

## 2021-04-21 NOTE — Telephone Encounter (Signed)
1.Medication Requested: potassium chloride SA (KLOR-CON) 20 MEQ tablet  2. Pharmacy (Name, Street, [facility]):  [facility] [phone] - [place], [place] - [address] AT [facility] Phone:  [phone]  Fax:  [phone]      3. On Med List: yes  4. Last Visit with PCP: 09.15.22  5. Next visit date with PCP: n/a   Agent: Please be advised that RX refills may take up to 3 business days. We ask that you follow-up with your pharmacy.

## 2021-05-10 NOTE — Progress Notes (Deleted)
   I, Christoper Fabian, LAT, ATC, am serving as scribe for Dr. Clementeen Graham.  [name] [name] is a 65 y.o. female who presents to [facility] at [facility] today for f/u of L hip/thigh/knee pain due to hip GT bursitis/aBductor tendinopathy.  She was last seen by Dr. Denyse Amass on 04/11/21 and was referred to PT at [facility] but has not completed any visits.  Today, pt reports   Diagnostic testing: L-spine XR- 04/11/10; R knee XR- 09/17/19; L hip XR- 09/17/19  Pertinent review of systems: ***  Relevant historical information: ***   Exam:  There were no vitals taken for this visit. General: Well Developed, well nourished, and in no acute distress.   MSK: ***    Lab and Radiology Results No results found for this or any previous visit (from the past 72 hour(s)). No results found.     Assessment and Plan: 65 y.o. female with ***   PDMP not reviewed this encounter. No orders of the defined types were placed in this encounter.  No orders of the defined types were placed in this encounter.    Discussed warning signs or symptoms. Please see discharge instructions. Patient expresses understanding.   ***

## 2021-05-11 ENCOUNTER — Ambulatory Visit: Payer: BC Managed Care – PPO | Admitting: Family Medicine

## 2021-05-19 ENCOUNTER — Other Ambulatory Visit: Payer: Self-pay | Admitting: Family

## 2021-05-27 ENCOUNTER — Telehealth: Payer: Self-pay | Admitting: Internal Medicine

## 2021-05-27 MED ORDER — ROSUVASTATIN CALCIUM 5 MG PO TABS
5.0000 mg | ORAL_TABLET | Freq: Every day | ORAL | 3 refills | Status: DC
Start: 2021-05-27 — End: 2022-05-23

## 2021-05-27 NOTE — Telephone Encounter (Signed)
1.Medication Requested: rosuvastatin (CRESTOR) 5 MG tablet   2. Pharmacy (Name, Street, [place]): [facility] [phone] - [place], [place] - [address] AT [address]  Phone:  [phone] Fax:  [phone]   3. On Med List: yes  4. Last Visit with PCP: 09.15.22  5. Next visit date with PCP: n/a   Agent: Please be advised that RX refills may take up to 3 business days. We ask that you follow-up with your pharmacy.

## 2021-05-31 ENCOUNTER — Encounter: Payer: Self-pay | Admitting: Physician Assistant

## 2021-05-31 ENCOUNTER — Ambulatory Visit (INDEPENDENT_AMBULATORY_CARE_PROVIDER_SITE_OTHER): Payer: BC Managed Care – PPO | Admitting: Physician Assistant

## 2021-05-31 VITALS — BP 110/60 | HR 82 | Ht 64.0 in | Wt 223.0 lb

## 2021-05-31 DIAGNOSIS — K529 Noninfective gastroenteritis and colitis, unspecified: Secondary | ICD-10-CM | POA: Diagnosis not present

## 2021-05-31 MED ORDER — NA SULFATE-K SULFATE-MG SULF 17.5-3.13-1.6 GM/177ML PO SOLN: 1.0000 | Freq: Once | ORAL | 0 refills | Status: AC

## 2021-05-31 NOTE — Patient Instructions (Signed)
You have been scheduled for a colonoscopy. Please follow written instructions given to you at your visit today.  Please pick up your prep supplies at the pharmacy within the next 1-3 days. If you use inhalers (even only as needed), please bring them with you on the day of your procedure.  If you are age 65 or older, your body mass index should be between 23-30. Your Body mass index is 38.28 kg/m. If this is out of the aforementioned range listed, please consider follow up with your Primary Care Provider.  If you are age 31 or younger, your body mass index should be between 19-25. Your Body mass index is 38.28 kg/m. If this is out of the aformentioned range listed, please consider follow up with your Primary Care Provider.   ________________________________________________________  The [facility] providers would like to encourage you to use [facility] to communicate with providers for non-urgent requests or questions.  Due to long hold times on the telephone, sending your provider a message by [facility] may be a faster and more efficient way to get a response.  Please allow 48 business hours for a response.  Please remember that this is for non-urgent requests.  _______________________________________________________
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
## 2021-05-31 NOTE — Progress Notes (Signed)
Agree with assessment and plan as outlined.  

## 2021-07-15 ENCOUNTER — Encounter: Payer: BC Managed Care – PPO | Admitting: Gastroenterology

## 2021-08-01 ENCOUNTER — Ambulatory Visit: Payer: BC Managed Care – PPO | Admitting: Internal Medicine

## 2021-08-01 ENCOUNTER — Other Ambulatory Visit: Payer: Self-pay

## 2021-08-08 ENCOUNTER — Ambulatory Visit: Payer: BC Managed Care – PPO | Admitting: Internal Medicine

## 2021-08-08 ENCOUNTER — Other Ambulatory Visit: Payer: Self-pay

## 2021-08-08 ENCOUNTER — Encounter: Payer: Self-pay | Admitting: Internal Medicine

## 2021-08-08 VITALS — BP 122/70 | HR 67 | Temp 99.1°F | Ht 64.0 in | Wt 217.0 lb

## 2021-08-08 DIAGNOSIS — I10 Essential (primary) hypertension: Secondary | ICD-10-CM | POA: Diagnosis not present

## 2021-08-08 DIAGNOSIS — G5603 Carpal tunnel syndrome, bilateral upper limbs: Secondary | ICD-10-CM | POA: Diagnosis not present

## 2021-08-08 DIAGNOSIS — E876 Hypokalemia: Secondary | ICD-10-CM | POA: Diagnosis not present

## 2021-08-08 DIAGNOSIS — E559 Vitamin D deficiency, unspecified: Secondary | ICD-10-CM | POA: Diagnosis not present

## 2021-08-08 DIAGNOSIS — R739 Hyperglycemia, unspecified: Secondary | ICD-10-CM

## 2021-08-08 MED ORDER — AMLODIPINE BESYLATE 5 MG PO TABS: 5.0000 mg | ORAL_TABLET | Freq: Every day | ORAL | 3 refills | Status: DC

## 2021-08-08 MED ORDER — TELMISARTAN 80 MG PO TABS: 80.0000 mg | ORAL_TABLET | Freq: Every day | ORAL | 3 refills | Status: DC

## 2021-08-08 NOTE — Patient Instructions (Signed)
Ok to stop the micardis HCT (80/25) AND stop the potassium  Please take all new medication as prescribed - the micardis 80 mg per day (for blood pressure)  Please take all new medication as prescribed - the amlodipine 5 mg per day (for blood pressure)  Please go to the LAB at the blood drawing area for the tests to be done - at the [facility] in 2 weeks  Please wear left and right wrist splints at night for the numbness in the hands  Please continue all other medications as before, and refills have been done if requested.  Please have the pharmacy call with any other refills you may need.  Please keep your appointments with your specialists as you may have planned  Please make an appt to come back after Sept 15, 2023 due to your insurance (or sooner if you have a problem)
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
## 2021-08-10 ENCOUNTER — Other Ambulatory Visit: Payer: Self-pay | Admitting: Family

## 2021-08-11 ENCOUNTER — Telehealth: Payer: Self-pay | Admitting: Internal Medicine

## 2021-08-11 NOTE — Telephone Encounter (Signed)
Patient requesting a call back to discuss amLODipine (NORVASC) 5 MG tablet and telmisartan (MICARDIS) 80 MG tablet

## 2021-08-11 NOTE — Telephone Encounter (Signed)
Left message for patient to call me back. 

## 2021-08-13 ENCOUNTER — Encounter: Payer: Self-pay | Admitting: Internal Medicine

## 2021-08-13 DIAGNOSIS — G5603 Carpal tunnel syndrome, bilateral upper limbs: Secondary | ICD-10-CM | POA: Insufficient documentation

## 2021-08-13 DIAGNOSIS — E559 Vitamin D deficiency, unspecified: Secondary | ICD-10-CM | POA: Insufficient documentation

## 2021-08-13 NOTE — Assessment & Plan Note (Signed)
Difficult to resolve with inability to tolerate the K supplements - will d/c for now

## 2021-08-13 NOTE — Assessment & Plan Note (Signed)
Mild bilateral- for wrist splints qhs and follow

## 2021-08-13 NOTE — Assessment & Plan Note (Signed)
Lab Results  Component Value Date   HGBA1C 5.5 04/07/2021   Stable, pt to continue current medical treatment  - diet  

## 2021-08-13 NOTE — Assessment & Plan Note (Signed)
Due to low K will need to d/c the micardis HCT in favor of start micardis 80 and amlodipine 5, cont to follow BP at home and next visit

## 2021-08-13 NOTE — Assessment & Plan Note (Signed)
Last vitamin D Lab Results  Component Value Date   VD25OH 14.30 (L) 04/07/2021   Low, to start oral replacement

## 2021-08-22 NOTE — Telephone Encounter (Signed)
Patient calling in  Requesting call back from nurse bc she has some questions about medication amLODipine (NORVASC) 5 MG tablet and telmisartan (MICARDIS) 80 MG tablet  Please call [phone]

## 2021-08-23 NOTE — Telephone Encounter (Signed)
Pt inquiring why provider has prescribed 2 different bp medications  Pt requesting a c/b  *see below*

## 2021-08-23 NOTE — Telephone Encounter (Signed)
Spoke with patient regarding why she is taking 2 BP medications. Patient verbalizes understanding

## 2021-08-23 NOTE — Telephone Encounter (Signed)
Returned patient call and voicemail was left.  Please see what questions the patient has.

## 2021-10-04 ENCOUNTER — Other Ambulatory Visit: Payer: Self-pay | Admitting: Internal Medicine

## 2021-10-04 ENCOUNTER — Encounter: Payer: Self-pay | Admitting: Internal Medicine

## 2021-10-04 ENCOUNTER — Other Ambulatory Visit (INDEPENDENT_AMBULATORY_CARE_PROVIDER_SITE_OTHER): Payer: BC Managed Care – PPO

## 2021-10-04 DIAGNOSIS — I10 Essential (primary) hypertension: Secondary | ICD-10-CM

## 2021-10-04 LAB — BASIC METABOLIC PANEL
BUN: 9 mg/dL (ref 6–23)
CO2: 28 mEq/L (ref 19–32)
Calcium: 8.7 mg/dL (ref 8.4–10.5)
Chloride: 104 mEq/L (ref 96–112)
Creatinine, Ser: 0.6 mg/dL (ref 0.40–1.20)
GFR: 94.05 mL/min (ref >60.00)
Glucose, Bld: 86 mg/dL (ref 70–99)
Potassium: 3.4 mEq/L — ABNORMAL LOW (ref 3.5–5.1)
Sodium: 141 mEq/L (ref 135–145)

## 2021-10-04 MED ORDER — POTASSIUM CHLORIDE ER 10 MEQ PO TBCR: 10.0000 meq | EXTENDED_RELEASE_TABLET | Freq: Every day | ORAL | 0 refills | Status: DC

## 2022-02-07 DIAGNOSIS — N6310 Unspecified lump in the right breast, unspecified quadrant: Secondary | ICD-10-CM | POA: Insufficient documentation

## 2022-02-08 ENCOUNTER — Other Ambulatory Visit: Payer: Self-pay | Admitting: Obstetrics and Gynecology

## 2022-02-08 DIAGNOSIS — N63 Unspecified lump in unspecified breast: Secondary | ICD-10-CM

## 2022-03-06 ENCOUNTER — Ambulatory Visit
Admission: RE | Admit: 2022-03-06 | Discharge: 2022-03-06 | Disposition: A | Discharge status: Home / Self Care | Payer: BC Managed Care – PPO | Source: Ambulatory Visit | Attending: Obstetrics and Gynecology | Admitting: Obstetrics and Gynecology

## 2022-03-06 DIAGNOSIS — N63 Unspecified lump in unspecified breast: Secondary | ICD-10-CM

## 2022-05-23 ENCOUNTER — Other Ambulatory Visit: Payer: Self-pay | Admitting: Internal Medicine

## 2022-05-23 NOTE — Telephone Encounter (Signed)
Please refill as per office routine med refill policy (all routine meds to be refilled for 3 mo or monthly (per pt preference) up to one year from last visit, then month to month grace period for 3 mo, then further med refills will have to be denied) ? ?

## 2022-07-07 ENCOUNTER — Ambulatory Visit: Payer: BC Managed Care – PPO | Admitting: Internal Medicine

## 2022-07-07 ENCOUNTER — Encounter: Payer: Self-pay | Admitting: Internal Medicine

## 2022-07-07 VITALS — BP 130/76 | HR 68 | Temp 98.2°F | Ht 64.0 in | Wt 196.0 lb

## 2022-07-07 DIAGNOSIS — E538 Deficiency of other specified B group vitamins: Secondary | ICD-10-CM | POA: Diagnosis not present

## 2022-07-07 DIAGNOSIS — Z23 Encounter for immunization: Secondary | ICD-10-CM | POA: Diagnosis not present

## 2022-07-07 DIAGNOSIS — E559 Vitamin D deficiency, unspecified: Secondary | ICD-10-CM | POA: Diagnosis not present

## 2022-07-07 DIAGNOSIS — N950 Postmenopausal bleeding: Secondary | ICD-10-CM | POA: Insufficient documentation

## 2022-07-07 DIAGNOSIS — R739 Hyperglycemia, unspecified: Secondary | ICD-10-CM

## 2022-07-07 DIAGNOSIS — E2839 Other primary ovarian failure: Secondary | ICD-10-CM

## 2022-07-07 DIAGNOSIS — Z0001 Encounter for general adult medical examination with abnormal findings: Secondary | ICD-10-CM | POA: Diagnosis not present

## 2022-07-07 DIAGNOSIS — E7800 Pure hypercholesterolemia, unspecified: Secondary | ICD-10-CM

## 2022-07-07 DIAGNOSIS — K529 Noninfective gastroenteritis and colitis, unspecified: Secondary | ICD-10-CM

## 2022-07-07 DIAGNOSIS — I10 Essential (primary) hypertension: Secondary | ICD-10-CM

## 2022-07-07 DIAGNOSIS — Z1211 Encounter for screening for malignant neoplasm of colon: Secondary | ICD-10-CM

## 2022-07-07 DIAGNOSIS — R079 Chest pain, unspecified: Secondary | ICD-10-CM | POA: Insufficient documentation

## 2022-07-07 LAB — HEPATIC FUNCTION PANEL
ALT: 11 U/L (ref 0–35)
AST: 18 U/L (ref 0–37)
Albumin: 3.9 g/dL (ref 3.5–5.2)
Alkaline Phosphatase: 70 U/L (ref 39–117)
Bilirubin, Direct: 0.1 mg/dL (ref 0.0–0.3)
Total Bilirubin: 0.5 mg/dL (ref 0.2–1.2)
Total Protein: 7.2 g/dL (ref 6.0–8.3)

## 2022-07-07 LAB — VITAMIN B12: Vitamin B-12: 176 pg/mL — ABNORMAL LOW (ref 211–911)

## 2022-07-07 LAB — CBC WITH DIFFERENTIAL/PLATELET
Basophils Absolute: 0 10*3/uL (ref 0.0–0.1)
Basophils Relative: 0.7 % (ref 0.0–3.0)
Eosinophils Absolute: 0 10*3/uL (ref 0.0–0.7)
Eosinophils Relative: 0.8 % (ref 0.0–5.0)
HCT: 41.5 % (ref 36.0–46.0)
Hemoglobin: 13.6 g/dL (ref 12.0–15.0)
Lymphocytes Relative: 24.8 % (ref 12.0–46.0)
Lymphs Abs: 1.4 10*3/uL (ref 0.7–4.0)
MCHC: 32.8 g/dL (ref 30.0–36.0)
MCV: 92.5 fl (ref 78.0–100.0)
Monocytes Absolute: 0.6 10*3/uL (ref 0.1–1.0)
Monocytes Relative: 10.4 % (ref 3.0–12.0)
Neutro Abs: 3.5 10*3/uL (ref 1.4–7.7)
Neutrophils Relative %: 63.3 % (ref 43.0–77.0)
Platelets: 211 10*3/uL (ref 150.0–400.0)
RBC: 4.49 Mil/uL (ref 3.87–5.11)
RDW: 14 % (ref 11.5–15.5)
WBC: 5.5 10*3/uL (ref 4.0–10.5)

## 2022-07-07 LAB — BASIC METABOLIC PANEL
BUN: 12 mg/dL (ref 6–23)
CO2: 32 mEq/L (ref 19–32)
Calcium: 9 mg/dL (ref 8.4–10.5)
Chloride: 105 mEq/L (ref 96–112)
Creatinine, Ser: 0.72 mg/dL (ref 0.40–1.20)
GFR: 87.15 mL/min (ref >60.00)
Glucose, Bld: 83 mg/dL (ref 70–99)
Potassium: 3.7 mEq/L (ref 3.5–5.1)
Sodium: 143 mEq/L (ref 135–145)

## 2022-07-07 LAB — VITAMIN D 25 HYDROXY (VIT D DEFICIENCY, FRACTURES): VITD: 18.28 ng/mL — ABNORMAL LOW (ref 30.00–100.00)

## 2022-07-07 LAB — URINALYSIS, ROUTINE W REFLEX MICROSCOPIC
Bilirubin Urine: NEGATIVE
Hgb urine dipstick: NEGATIVE
Ketones, ur: NEGATIVE
Leukocytes,Ua: NEGATIVE
Nitrite: NEGATIVE
Specific Gravity, Urine: 1.02 (ref 1.000–1.030)
Total Protein, Urine: NEGATIVE
Urine Glucose: NEGATIVE
Urobilinogen, UA: 0.2 (ref 0.0–1.0)
pH: 7 (ref 5.0–8.0)

## 2022-07-07 LAB — LIPID PANEL
Cholesterol: 173 mg/dL (ref 0–200)
HDL: 79.5 mg/dL (ref >39.00)
LDL Cholesterol: 85 mg/dL (ref 0–99)
NonHDL: 93.05
Total CHOL/HDL Ratio: 2
Triglycerides: 42 mg/dL (ref 0.0–149.0)
VLDL: 8.4 mg/dL (ref 0.0–40.0)

## 2022-07-07 LAB — HEMOGLOBIN A1C: Hgb A1c MFr Bld: 5.4 % (ref 4.6–6.5)

## 2022-07-07 LAB — TSH: TSH: 1.52 u[IU]/mL (ref 0.35–5.50)
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
## 2022-07-07 NOTE — Patient Instructions (Addendum)
Please schedule the bone density test before leaving today at the scheduling desk (where you check out)  You had the flu shot today  Your EKG was done today  Please return in 1-2 weeks for a NURSE VISIT for the Prevnar 20 shot (pneumonia shot)  Please have your Shingrix (shingles) shots done at your local pharmacy.  Please continue all other medications as before, and refills have been done if requested.  Please have the pharmacy call with any other refills you may need.  Please continue your efforts at being more active, low cholesterol diet, and weight control.  You are otherwise up to date with prevention measures today.  Please keep your appointments with your specialists as you may have planned  You will be contacted regarding the referral for: stress test, and colonoscopy  Please go to the LAB at the blood drawing area for the tests to be done  You will be contacted by phone if any changes need to be made immediately.  Otherwise, you will receive a letter about your results with an explanation, but please check with MyChart first.  Please remember to sign up for MyChart if you have not done so, as this will be important to you in the future with finding out test results, communicating by private email, and scheduling acute appointments online when needed.  Please make an Appointment to return in 6 months, or sooner if needed

## 2022-07-09 ENCOUNTER — Encounter: Payer: Self-pay | Admitting: Internal Medicine

## 2022-07-09 NOTE — Assessment & Plan Note (Signed)
Atpyical, can't r/o anginal equivalent, ECG reviewed, for stress test r/o ischemia

## 2022-07-09 NOTE — Assessment & Plan Note (Signed)
Also for f/u K today with labs

## 2022-07-09 NOTE — Assessment & Plan Note (Signed)
Last vitamin D Lab Results  Component Value Date   VD25OH 18.28 (L) 07/07/2022   Low, to start oral replacement

## 2022-07-09 NOTE — Assessment & Plan Note (Signed)
BP Readings from Last 3 Encounters:  07/07/22 130/76  08/08/21 122/70  05/31/21 110/60   Stable, pt to continue medical treatment micardis 80 mg, norvasc 5 mg

## 2022-07-09 NOTE — Assessment & Plan Note (Signed)
Also for DXA as she is due for screening

## 2022-07-09 NOTE — Assessment & Plan Note (Signed)
Lab Results  Component Value Date   HGBA1C 5.4 07/07/2022   Stable, pt to continue current medical treatment   - diet, wt control, excercise

## 2022-07-09 NOTE — Assessment & Plan Note (Signed)
Lab Results  Component Value Date   LDLCALC 85 07/07/2022   Stable, pt to continue current statin  crestor 5 mg qd, declines any increase today

## 2022-07-09 NOTE — Assessment & Plan Note (Signed)
Age and sex appropriate education and counseling updated with regular exercise and diet Referrals for preventative services - for colonoscopy Immunizations addressed - declines prevnar, but for flu shot today, shingrx at pharmacy Smoking counseling  - none needed Evidence for depression or other mood disorder - none significant Most recent labs reviewed. I have personally reviewed and have noted: 1) the patient's medical and social history 2) The patient's current medications and supplements 3) The patient's height, weight, and BMI have been recorded in the chart

## 2022-08-07 ENCOUNTER — Other Ambulatory Visit (HOSPITAL_COMMUNITY): Payer: BC Managed Care – PPO

## 2022-08-09 ENCOUNTER — Telehealth (HOSPITAL_COMMUNITY): Payer: Self-pay | Admitting: *Deleted

## 2022-08-09 NOTE — Telephone Encounter (Signed)
Left message on voicemail per DPR in reference to upcoming appointment scheduled on 08/14/22 at 12:45 with detailed instructions given per Myocardial Perfusion Study Information Sheet for the test. LM to arrive 15 minutes early, and that it is imperative to arrive on time for appointment to keep from having the test rescheduled. If you need to cancel or reschedule your appointment, please call the office within 24 hours of your appointment. Failure to do so may result in a cancellation of your appointment, and a $50 no show fee. Phone number given for call back for any questions.

## 2022-08-14 ENCOUNTER — Ambulatory Visit (HOSPITAL_COMMUNITY): Payer: BC Managed Care – PPO | Attending: Cardiology

## 2022-08-14 ENCOUNTER — Encounter: Payer: Self-pay | Admitting: Internal Medicine

## 2022-08-14 DIAGNOSIS — R079 Chest pain, unspecified: Secondary | ICD-10-CM | POA: Insufficient documentation

## 2022-08-14 LAB — MYOCARDIAL PERFUSION IMAGING
LV dias vol: 93 mL (ref 46–106)
LV sys vol: 40 mL
Nuc Stress EF: 57 %
Peak HR: 83 {beats}/min
Rest HR: 52 {beats}/min
Rest Nuclear Isotope Dose: 10.4 mCi
SDS: 1
SRS: 0
SSS: 1
ST Depression (mm): 0 mm
Stress Nuclear Isotope Dose: 32.2 mCi
TID: 0.94

## 2022-08-14 MED ORDER — TECHNETIUM TC 99M TETROFOSMIN IV KIT
32.2000 | PACK | Freq: Once | INTRAVENOUS | Status: AC | PRN
  Administered 2022-08-14: 32.2 via INTRAVENOUS

## 2022-08-14 MED ORDER — TECHNETIUM TC 99M TETROFOSMIN IV KIT
10.4000 | PACK | Freq: Once | INTRAVENOUS | Status: AC | PRN
  Administered 2022-08-14: 10.4 via INTRAVENOUS

## 2022-08-14 MED ORDER — REGADENOSON 0.4 MG/5ML IV SOLN
0.4000 mg | Freq: Once | INTRAVENOUS | Status: AC
  Administered 2022-08-14: 0.4 mg via INTRAVENOUS

## 2022-08-16 ENCOUNTER — Other Ambulatory Visit: Payer: Self-pay | Admitting: Internal Medicine

## 2022-08-16 NOTE — Telephone Encounter (Signed)
Please refill as per office routine med refill policy (all routine meds to be refilled for 3 mo or monthly (per pt preference) up to one year from last visit, then month to month grace period for 3 mo, then further med refills will have to be denied)

## 2022-08-22 ENCOUNTER — Encounter: Payer: Self-pay | Admitting: Internal Medicine

## 2022-08-26 ENCOUNTER — Telehealth: Payer: Self-pay | Admitting: Internal Medicine

## 2022-08-26 NOTE — Telephone Encounter (Signed)
Please refill as per office routine med refill policy (all routine meds to be refilled for 3 mo or monthly (per pt preference) up to one year from last visit, then month to month grace period for 3 mo, then further med refills will have to be denied) ? ?

## 2022-08-28 ENCOUNTER — Other Ambulatory Visit: Payer: Self-pay

## 2022-08-28 MED ORDER — TELMISARTAN 80 MG PO TABS: 80.0000 mg | ORAL_TABLET | Freq: Every day | ORAL | 3 refills | Status: DC

## 2022-08-28 NOTE — Telephone Encounter (Signed)
Refill sent to pharmacy.   

## 2022-08-28 NOTE — Telephone Encounter (Signed)
PT calls in today regarding this RX request. PT was last seen by [place] 07/07/22 for their physical and has a future appointment set for 01/01/2023. Pharmacy is not able to fill her RX for  telmisartan (MICARDIS) 80 MG tablet without provider approval.  PT completley out of this medication currently.  CB: [phone]

## 2022-11-09 ENCOUNTER — Encounter: Payer: Self-pay | Admitting: *Deleted

## 2022-11-16 ENCOUNTER — Ambulatory Visit: Payer: BC Managed Care – PPO | Admitting: Physician Assistant

## 2022-11-16 ENCOUNTER — Encounter: Payer: Self-pay | Admitting: Physician Assistant

## 2022-11-16 VITALS — BP 130/68 | HR 67 | Ht 64.0 in | Wt 195.0 lb

## 2022-11-16 DIAGNOSIS — R197 Diarrhea, unspecified: Secondary | ICD-10-CM | POA: Diagnosis not present

## 2022-11-16 DIAGNOSIS — R634 Abnormal weight loss: Secondary | ICD-10-CM

## 2022-11-16 MED ORDER — NA SULFATE-K SULFATE-MG SULF 17.5-3.13-1.6 GM/177ML PO SOLN: ORAL | 0 refills | Status: DC
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
## 2022-11-16 NOTE — Patient Instructions (Addendum)
You have been scheduled for an endoscopy and colonoscopy. Please follow the written instructions given to you at your visit today. Please pick up your prep supplies at the pharmacy within the next 1-3 days. If you use inhalers (even only as needed), please bring them with you on the day of your procedure.  _______________________________________________________  We have given you information regarding a transportation service that may be used for your endoscopy/colonoscopy should you need it. _______________________________________________________ If your blood pressure at your visit was 140/90 or greater, please contact your primary care physician to follow up on this.  _______________________________________________________  If you are age 49 or older, your body mass index should be between 23-30. Your Body mass index is 33.47 kg/m. If this is out of the aforementioned range listed, please consider follow up with your Primary Care Provider.  If you are age 43 or younger, your body mass index should be between 19-25. Your Body mass index is 33.47 kg/m. If this is out of the aformentioned range listed, please consider follow up with your Primary Care Provider.   ________________________________________________________  The [facility] providers would like to encourage you to use [facility] to communicate with providers for non-urgent requests or questions.  Due to long hold times on the telephone, sending your provider a message by [facility] may be a faster and more efficient way to get a response.  Please allow 48 business hours for a response.  Please remember that this is for non-urgent requests.  _______________________________________________________  Due to recent changes in healthcare laws, you may see the results of your imaging and laboratory studies on MyChart before your provider has had a chance to review them.  We understand that in some cases there may be results that are confusing or  concerning to you. Not all laboratory results come back in the same time frame and the provider may be waiting for multiple results in order to interpret others.  Please give [place] 48 hours in order for your provider to thoroughly review all the results before contacting the office for clarification of your results.

## 2022-11-16 NOTE — Progress Notes (Signed)
Agree with assessment and plan as outlined.  

## 2022-11-23 ENCOUNTER — Other Ambulatory Visit: Payer: Self-pay

## 2022-11-23 MED ORDER — AMLODIPINE BESYLATE 5 MG PO TABS: 5.0000 mg | ORAL_TABLET | Freq: Every day | ORAL | 2 refills | Status: DC

## 2022-12-22 ENCOUNTER — Other Ambulatory Visit: Payer: Self-pay

## 2022-12-22 ENCOUNTER — Encounter: Payer: Self-pay | Admitting: Gastroenterology

## 2022-12-22 DIAGNOSIS — I10 Essential (primary) hypertension: Secondary | ICD-10-CM

## 2022-12-22 MED ORDER — TELMISARTAN 80 MG PO TABS: 80.0000 mg | ORAL_TABLET | Freq: Every day | ORAL | 3 refills | Status: DC

## 2023-01-01 ENCOUNTER — Ambulatory Visit: Payer: BC Managed Care – PPO | Admitting: Internal Medicine

## 2023-01-09 ENCOUNTER — Encounter: Payer: Self-pay | Admitting: Gastroenterology

## 2023-01-09 ENCOUNTER — Ambulatory Visit (AMBULATORY_SURGERY_CENTER): Payer: BC Managed Care – PPO | Admitting: Gastroenterology

## 2023-01-09 VITALS — BP 162/74 | HR 48 | Temp 97.5°F | Resp 9 | Ht 64.0 in | Wt 195.0 lb

## 2023-01-09 DIAGNOSIS — R634 Abnormal weight loss: Secondary | ICD-10-CM

## 2023-01-09 DIAGNOSIS — D120 Benign neoplasm of cecum: Secondary | ICD-10-CM

## 2023-01-09 DIAGNOSIS — K6389 Other specified diseases of intestine: Secondary | ICD-10-CM | POA: Diagnosis not present

## 2023-01-09 DIAGNOSIS — K2970 Gastritis, unspecified, without bleeding: Secondary | ICD-10-CM

## 2023-01-09 DIAGNOSIS — K635 Polyp of colon: Secondary | ICD-10-CM | POA: Diagnosis not present

## 2023-01-09 DIAGNOSIS — B9681 Helicobacter pylori [H. pylori] as the cause of diseases classified elsewhere: Secondary | ICD-10-CM | POA: Diagnosis not present

## 2023-01-09 DIAGNOSIS — K529 Noninfective gastroenteritis and colitis, unspecified: Secondary | ICD-10-CM

## 2023-01-09 MED ORDER — SODIUM CHLORIDE 0.9 % IV SOLN: 500.0000 mL | INTRAVENOUS | Status: DC

## 2023-01-09 NOTE — Patient Instructions (Signed)
YOU HAD AN ENDOSCOPIC PROCEDURE TODAY: Refer to the procedure report and other information in the discharge instructions given to you for any specific questions about what was found during the examination. If this information does not answer your questions, please call [place] office at [phone] to clarify.  ° °YOU SHOULD EXPECT: Some feelings of bloating in the abdomen. Passage of more gas than usual. Walking can help get rid of the air that was put into your GI tract during the procedure and reduce the bloating. If you had a lower endoscopy (such as a colonoscopy or flexible sigmoidoscopy) you may notice spotting of blood in your stool or on the toilet paper. Some abdominal soreness may be present for a day or two, also. ° °DIET: Your first meal following the procedure should be a light meal and then it is ok to progress to your normal diet. A half-sandwich or bowl of soup is an example of a good first meal. Heavy or fried foods are harder to digest and may make you feel nauseous or bloated. Drink plenty of fluids but you should avoid alcoholic beverages for 24 hours. If you had a esophageal dilation, please see attached instructions for diet.   ° °ACTIVITY: Your care partner should take you home directly after the procedure. You should plan to take it easy, moving slowly for the rest of the day. You can resume normal activity the day after the procedure however YOU SHOULD NOT DRIVE, use power tools, machinery or perform tasks that involve climbing or major physical exertion for 24 hours (because of the sedation medicines used during the test).  ° °SYMPTOMS TO REPORT IMMEDIATELY: °A gastroenterologist can be reached at any hour. Please call [phone]  for any of the following symptoms:  °Following lower endoscopy (colonoscopy, flexible sigmoidoscopy) °Excessive amounts of blood in the stool  °Significant tenderness, worsening of abdominal pains  °Swelling of the abdomen that is new, acute  °Fever of 100° or  higher  °Following upper endoscopy (EGD, EUS, ERCP, esophageal dilation) °Vomiting of blood or coffee ground material  °New, significant abdominal pain  °New, significant chest pain or pain under the shoulder blades  °Painful or persistently difficult swallowing  °New shortness of breath  °Black, tarry-looking or red, bloody stools ° °FOLLOW UP:  °If any biopsies were taken you will be contacted by phone or by letter within the next 1-3 weeks. Call [phone]  if you have not heard about the biopsies in 3 weeks.  °Please also call with any specific questions about appointments or follow up tests. ° °

## 2023-01-09 NOTE — Progress Notes (Signed)
Pt's states no medical or surgical changes since previsit or office visit. 

## 2023-01-09 NOTE — Op Note (Signed)
[facility] Patient Name: [name] [name] Procedure Date: 01/09/2023 10:40 AM MRN: [id number] Endoscopist: Viviann Spare P. Adela Lank , MD, [id number] Age: 67 Referring MD:  Date of Birth: 1955-11-15 Gender: Female Account #: [id number] Procedure:                Upper GI endoscopy Indications:              Diarrhea, Weight loss Medicines:                Monitored Anesthesia Care Procedure:                Pre-Anesthesia Assessment:                           - Prior to the procedure, a History and Physical                            was performed, and patient medications and                            allergies were reviewed. The patient's tolerance of                            previous anesthesia was also reviewed. The risks                            and benefits of the procedure and the sedation                            options and risks were discussed with the patient.                            All questions were answered, and informed consent                            was obtained. Prior Anticoagulants: The patient has                            taken no anticoagulant or antiplatelet agents. ASA                            Grade Assessment: II - A patient with mild systemic                            disease. After reviewing the risks and benefits,                            the patient was deemed in satisfactory condition to                            undergo the procedure.                           After obtaining informed consent, the endoscope was  passed under direct vision. Throughout the                            procedure, the patient's blood pressure, pulse, and                            oxygen saturations were monitored continuously. The                            Olympus Scope [id number] was introduced through the                            mouth, and advanced to the second part of duodenum.                            The upper GI endoscopy  was accomplished without                            difficulty. The patient tolerated the procedure                            well. Scope In: Scope Out: Findings:                 Esophagogastric landmarks were identified: the                            Z-line was found at 38 cm, the gastroesophageal                            junction was found at 38 cm and the upper extent of                            the gastric folds was found at 38 cm from the                            incisors.                           The exam of the esophagus was otherwise normal.                           Diffuse mildly erythematous mucosa was found in the                            entire examined stomach.                           Atrophic mucosa was found in the entire examined                            stomach.                           The exam of the stomach was otherwise normal.  Biopsies were taken with a cold forceps for                            Helicobacter pylori testing.                           The examined duodenum was normal. Biopsies for                            histology were taken with a cold forceps for                            evaluation of celiac disease. Complications:            No immediate complications. Estimated blood loss:                            Minimal. Estimated Blood Loss:     Estimated blood loss was minimal. Impression:               - Esophagogastric landmarks identified.                           - Normal esophagus otherwise.                           - Erythematous mucosa in the stomach.                           - Gastric mucosal atrophy.                           - Normal stomach otherwise - biopsies taken to rule                            out H pylori                           - Normal examined duodenum. Biopsied. Recommendation:           - Patient has a contact number available for                            emergencies. The  signs and symptoms of potential                            delayed complications were discussed with the                            patient. Return to normal activities tomorrow.                            Written discharge instructions were provided to the                            patient.                           -  Resume previous diet.                           - Continue present medications.                           - Await pathology results with further                            recommendations. Viviann Spare P. Dashanti Burr, MD 01/09/2023 11:21:56 AM This report has been signed electronically.

## 2023-01-09 NOTE — Progress Notes (Signed)
Called to room to assist during endoscopic procedure.  Patient ID and intended procedure confirmed with present staff. Received instructions for my participation in the procedure from the performing physician.  
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
## 2023-01-09 NOTE — Progress Notes (Signed)
Vss nad trans to pacu 

## 2023-01-09 NOTE — Op Note (Signed)
[facility] Patient Name: [name] [name] Procedure Date: 01/09/2023 10:31 AM MRN: [id number] Endoscopist: Viviann Spare P. Adela Lank , MD, [id number] Age: 67 Referring MD:  Date of Birth: Sep 09, 1955 Gender: Female Account #: [id number] Procedure:                Colonoscopy Indications:              Chronic diarrhea, Weight loss, positive fecal                            lactoferrin in the past Medicines:                Monitored Anesthesia Care Procedure:                Pre-Anesthesia Assessment:                           - Prior to the procedure, a History and Physical                            was performed, and patient medications and                            allergies were reviewed. The patient's tolerance of                            previous anesthesia was also reviewed. The risks                            and benefits of the procedure and the sedation                            options and risks were discussed with the patient.                            All questions were answered, and informed consent                            was obtained. Prior Anticoagulants: The patient has                            taken no anticoagulant or antiplatelet agents. ASA                            Grade Assessment: II - A patient with mild systemic                            disease. After reviewing the risks and benefits,                            the patient was deemed in satisfactory condition to                            undergo the procedure.  After obtaining informed consent, the colonoscope                            was passed under direct vision. Throughout the                            procedure, the patient's blood pressure, pulse, and                            oxygen saturations were monitored continuously. The                            CF [id number] #[id number] was introduced through the anus                            and advanced to the the  terminal ileum, with                            identification of the appendiceal orifice and IC                            valve. The colonoscopy was performed without                            difficulty. The patient tolerated the procedure                            well. The quality of the bowel preparation was                            good. The terminal ileum, ileocecal valve,                            appendiceal orifice, and rectum were photographed. Scope In: 10:54:24 AM Scope Out: 11:12:51 AM Scope Withdrawal Time: 0 hours 12 minutes 59 seconds  Total Procedure Duration: 0 hours 18 minutes 27 seconds  Findings:                 The perianal and digital rectal examinations were                            normal.                           The terminal ileum appeared normal.                           The colon was tortuous with looping in the right                            colon - abdominal pressure utilized to achieve                            cecal intubation.  A diminutive polyp was found in the cecum. The                            polyp was sessile. The polyp was removed with a                            cold biopsy forceps. Resection and retrieval were                            complete.                           Internal hemorrhoids were found.                           The exam was otherwise without abnormality.                           Biopsies for histology were taken with a cold                            forceps from the right colon, left colon and                            transverse colon for evaluation of microscopic                            colitis. Complications:            No immediate complications. Estimated blood loss:                            Minimal. Estimated Blood Loss:     Estimated blood loss was minimal. Impression:               - The examined portion of the ileum was normal.                           - Tortuous  colon with looping in the right colon.                           - One diminutive polyp in the cecum, removed with a                            cold biopsy forceps. Resected and retrieved.                           - Internal hemorrhoids.                           - The examination was otherwise normal.                           - Biopsies were taken with a cold forceps from the  right colon, left colon and transverse colon for                            evaluation of microscopic colitis. Recommendation:           - Patient has a contact number available for                            emergencies. The signs and symptoms of potential                            delayed complications were discussed with the                            patient. Return to normal activities tomorrow.                            Written discharge instructions were provided to the                            patient.                           - Resume previous diet.                           - Continue present medications.                           - Await pathology results with further                            recommendations on management. Viviann Spare P. Dajon Lazar, MD 01/09/2023 11:17:43 AM This report has been signed electronically.

## 2023-01-10 ENCOUNTER — Telehealth: Payer: Self-pay

## 2023-01-10 NOTE — Telephone Encounter (Signed)
  Follow up Call-     01/09/2023    9:58 AM  Call back number  Post procedure Call Back phone  # [phone]  Permission to leave phone message Yes     Patient questions:  Do you have a fever, pain , or abdominal swelling? No. Pain Score  0 *  Have you tolerated food without any problems? Yes.    Have you been able to return to your normal activities? Yes.    Do you have any questions about your discharge instructions: Diet   No. Medications  No. Follow up visit  No.  Do you have questions or concerns about your Care? No.  Actions: * If pain score is 4 or above: No action needed, pain <4.

## 2023-01-16 ENCOUNTER — Other Ambulatory Visit: Payer: Self-pay

## 2023-01-16 DIAGNOSIS — B9681 Helicobacter pylori [H. pylori] as the cause of diseases classified elsewhere: Secondary | ICD-10-CM

## 2023-01-16 MED ORDER — METRONIDAZOLE 250 MG PO TABS: 250.0000 mg | ORAL_TABLET | Freq: Four times a day (QID) | ORAL | 0 refills | Status: AC

## 2023-01-16 MED ORDER — OMEPRAZOLE 20 MG PO CPDR: 20.0000 mg | DELAYED_RELEASE_CAPSULE | Freq: Two times a day (BID) | ORAL | 0 refills | Status: DC

## 2023-01-16 MED ORDER — DOXYCYCLINE HYCLATE 100 MG PO CAPS: 100.0000 mg | ORAL_CAPSULE | Freq: Two times a day (BID) | ORAL | 0 refills | Status: AC

## 2023-01-16 MED ORDER — BISMUTH SUBSALICYLATE 262 MG PO CHEW: 524.0000 mg | CHEWABLE_TABLET | Freq: Three times a day (TID) | ORAL | 0 refills | Status: AC

## 2023-01-31 ENCOUNTER — Telehealth: Payer: Self-pay

## 2023-01-31 NOTE — Telephone Encounter (Signed)
Called and spoke with pt to see if she completed quad therapy for H. Pylori so that Budesonide prescription could be sent in. Patient states that she has not been able to take the medications as prescribed. Pt reports having about 16 pills of Doxycyline left - she has only been taking one a day. Flagyl she has only been taking 1-2 pills daily. Pt never got the Pepto-Bismol. Pt states that she was afraid that the medications would interact with her BP medications. I told pt that there should not be any interaction between the medications and the pharmacist would have disclosed this information to her as well. I told pt that I would send you a message to see how you wanted her to proceed. Pt is aware that you are out of the office next week. Pt will hold medications until further advised. Please advise, thanks!

## 2023-01-31 NOTE — Telephone Encounter (Signed)
-----   Message from Missy Sabins, RN sent at 01/16/2023  2:33 PM EDT ----- Regarding: Lymphocytic colitis treatment Once done with H. pylori treatment, recommend treating for lymphocytic colitis with budesonide 9 mg daily for 6 weeks, then reduce to 6 mg for 2 weeks, then to 3 mg for 2 weeks, then stop  Need to send RX for Budesonide

## 2023-02-01 NOTE — Telephone Encounter (Signed)
Unfortunately please let her know given the way she took the antibiotics she needs an entirely new course of treatment as the way she took it will not eradicate the H pylori, she should have called [place] if she had questions about it.  We can either repeat the regimen and ask her to take it as prescribed for the 2 weeks, or give her a course of Pylera as you mentioned, as she has a clarithromycin allergy, up to her. Regimen should be safe with the BP meds, up to her. Thanks

## 2023-02-02 NOTE — Telephone Encounter (Signed)
Lm on vm for patient to return call 

## 2023-02-06 MED ORDER — METRONIDAZOLE 250 MG PO TABS: 250.0000 mg | ORAL_TABLET | Freq: Four times a day (QID) | ORAL | 0 refills | Status: AC

## 2023-02-06 MED ORDER — DOXYCYCLINE HYCLATE 100 MG PO CAPS: 100.0000 mg | ORAL_CAPSULE | Freq: Two times a day (BID) | ORAL | 0 refills | Status: AC

## 2023-02-06 MED ORDER — OMEPRAZOLE 20 MG PO CPDR: 20.0000 mg | DELAYED_RELEASE_CAPSULE | Freq: Two times a day (BID) | ORAL | 0 refills | Status: DC

## 2023-02-06 MED ORDER — BISMUTH SUBSALICYLATE 262 MG PO CHEW: 524.0000 mg | CHEWABLE_TABLET | Freq: Three times a day (TID) | ORAL | 0 refills | Status: AC

## 2023-02-06 NOTE — Telephone Encounter (Signed)
Inbound call from patient returning previous phone call. Requesting a call back. Please advise, thank you.

## 2023-02-06 NOTE — Addendum Note (Signed)
Addended by: Missy Sabins on: 02/06/2023 12:29 PM   Modules accepted: Orders

## 2023-02-06 NOTE — Telephone Encounter (Signed)
2nd attempt to reach patient - Lm on vm for patient to return call. 

## 2023-02-06 NOTE — Telephone Encounter (Signed)
Called and informed patient that she will need to restart quad therapy for H. Pylori treatment. Patient has been advised that I have sent new prescription for all 4 medications (Omeprazole, Pepto-Bismol, Doxycycline, and Metronidazole). Patient has been advised to take all medications as written on prescription for the next 2 weeks. I will call her in about 6-weeks to remind her about H. Pylori stool test to confirm eradication and discuss starting Budesonide therapy. Patient verbalized understanding and had no concerns at the end of the call.  Prescriptions sent to [facility] pharmacy on file.   H. Pylori stool test reminder in epic.

## 2023-02-27 ENCOUNTER — Telehealth: Payer: Self-pay

## 2023-02-27 NOTE — Telephone Encounter (Signed)
-----   Message from Nurse [place] P sent at 01/16/2023  2:35 PM EDT ----- Regarding: [employer] H. Pylori [facility] H. Pylori - order in epic

## 2023-02-28 NOTE — Telephone Encounter (Signed)
Spoke with patient yesterday to remind her that she is due for H. Pylori [facility] stool test at this time. Pt knows to stop by the 2nd floor receptionist desk to pick up stool kit and collection instructions. Pt verbalized understanding and had no concerns at the end of the call.  [employer] H. Pylori order in epic. Stool kit and collection instructions placed at 2nd floor receptionist desk for patient to pick up this week.

## 2023-03-08 ENCOUNTER — Telehealth: Payer: Self-pay | Admitting: Gastroenterology

## 2023-03-08 NOTE — Telephone Encounter (Signed)
Jan can you let this patient know her H. pylori testing is negative, treatment was successful in eradicating it.  Thanks

## 2023-03-08 NOTE — Telephone Encounter (Signed)
Patient informed that H. Pylori testing was negative. Patient had no questions at the end of call.

## 2023-03-20 ENCOUNTER — Telehealth: Payer: Self-pay

## 2023-03-20 MED ORDER — BUDESONIDE 3 MG PO CPEP: ORAL_CAPSULE | ORAL | 0 refills | Status: AC

## 2023-03-20 NOTE — Telephone Encounter (Signed)
-----   Message from Nurse [place] P sent at 02/06/2023 12:29 PM EDT ----- Regarding: Lymphocytic colitis treatment H. Pylori stool test due   Once done with H. pylori treatment, recommend treating for lymphocytic colitis with budesonide 9 mg daily for 6 weeks, then reduce to 6 mg for 2 weeks, then to 3 mg for 2 weeks, then stop   Need to send RX for Budesonide

## 2023-03-20 NOTE — Telephone Encounter (Signed)
Pt returned call. We reviewed recommendations for lymphocytic colitis treatment as previously recommended. Pt has been advised that prescription has been sent to her pharmacy and pt has been advised to keep October OV as scheduled for re-assessment. Pt verbalized understanding and had no concerns at the end of the call.   Budesonide prescription sent to pharmacy on file, per pt request.

## 2023-03-20 NOTE — Telephone Encounter (Signed)
Lm on vm for patient to return call to discuss starting Budesonide for lymphocytic colitis since H. Pylori test is negative.

## 2023-04-04 ENCOUNTER — Other Ambulatory Visit: Payer: BC Managed Care – PPO

## 2023-04-04 ENCOUNTER — Ambulatory Visit (INDEPENDENT_AMBULATORY_CARE_PROVIDER_SITE_OTHER)
Admission: RE | Admit: 2023-04-04 | Discharge: 2023-04-04 | Disposition: A | Discharge status: Home / Self Care | Payer: BC Managed Care – PPO | Source: Ambulatory Visit | Attending: Internal Medicine | Admitting: Internal Medicine

## 2023-04-04 DIAGNOSIS — E2839 Other primary ovarian failure: Secondary | ICD-10-CM

## 2023-04-05 ENCOUNTER — Encounter: Payer: Self-pay | Admitting: Internal Medicine

## 2023-04-05 DIAGNOSIS — E2839 Other primary ovarian failure: Secondary | ICD-10-CM | POA: Diagnosis not present

## 2023-05-02 ENCOUNTER — Ambulatory Visit: Payer: BC Managed Care – PPO | Admitting: Gastroenterology

## 2023-05-02 ENCOUNTER — Encounter: Payer: Self-pay | Admitting: Gastroenterology

## 2023-05-02 VITALS — BP 150/86 | HR 76 | Ht 64.0 in | Wt 221.0 lb

## 2023-05-02 DIAGNOSIS — Z8619 Personal history of other infectious and parasitic diseases: Secondary | ICD-10-CM

## 2023-05-02 DIAGNOSIS — K52832 Lymphocytic colitis: Secondary | ICD-10-CM

## 2023-05-02 NOTE — Patient Instructions (Signed)
If your blood pressure at your visit was 140/90 or greater, please contact your primary care physician to follow up on this. ______________________________________________________  If you are age 67 or older, your body mass index should be between 23-30. Your Body mass index is 37.93 kg/m. If this is out of the aforementioned range listed, please consider follow up with your Primary Care Provider.  If you are age 91 or younger, your body mass index should be between 19-25. Your Body mass index is 37.93 kg/m. If this is out of the aformentioned range listed, please consider follow up with your Primary Care Provider.  ________________________________________________________  The [facility] providers would like to encourage you to use [facility] to communicate with providers for non-urgent requests or questions.  Due to long hold times on the telephone, sending your provider a message by [facility] may be a faster and more efficient way to get a response.  Please allow 48 business hours for a response.  Please remember that this is for non-urgent requests.  _______________________________________________________  Due to recent changes in healthcare laws, you may see the results of your imaging and laboratory studies on MyChart before your provider has had a chance to review them.  We understand that in some cases there may be results that are confusing or concerning to you. Not all laboratory results come back in the same time frame and the provider may be waiting for multiple results in order to interpret others.  Please give [place] 48 hours in order for your provider to thoroughly review all the results before contacting the office for clarification of your results.    Taper your budesonide  Use Imodium or bismuth for recurrent symptoms.  Thank you for entrusting me with your care and for choosing [facility], Dr. Ileene Patrick
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
## 2023-06-01 ENCOUNTER — Other Ambulatory Visit: Payer: Self-pay

## 2023-06-01 ENCOUNTER — Other Ambulatory Visit: Payer: Self-pay | Admitting: Internal Medicine

## 2023-06-08 ENCOUNTER — Other Ambulatory Visit: Payer: Self-pay | Admitting: Internal Medicine

## 2023-06-08 ENCOUNTER — Ambulatory Visit: Payer: BC Managed Care – PPO | Admitting: Internal Medicine

## 2023-06-08 ENCOUNTER — Encounter: Payer: Self-pay | Admitting: Internal Medicine

## 2023-06-08 VITALS — BP 142/86 | HR 82 | Temp 98.7°F | Ht 64.0 in | Wt 229.0 lb

## 2023-06-08 DIAGNOSIS — F17200 Nicotine dependence, unspecified, uncomplicated: Secondary | ICD-10-CM | POA: Diagnosis not present

## 2023-06-08 DIAGNOSIS — E7800 Pure hypercholesterolemia, unspecified: Secondary | ICD-10-CM | POA: Diagnosis not present

## 2023-06-08 DIAGNOSIS — Z0001 Encounter for general adult medical examination with abnormal findings: Secondary | ICD-10-CM

## 2023-06-08 DIAGNOSIS — K529 Noninfective gastroenteritis and colitis, unspecified: Secondary | ICD-10-CM

## 2023-06-08 DIAGNOSIS — E538 Deficiency of other specified B group vitamins: Secondary | ICD-10-CM | POA: Diagnosis not present

## 2023-06-08 DIAGNOSIS — Z23 Encounter for immunization: Secondary | ICD-10-CM | POA: Diagnosis not present

## 2023-06-08 DIAGNOSIS — R739 Hyperglycemia, unspecified: Secondary | ICD-10-CM | POA: Diagnosis not present

## 2023-06-08 DIAGNOSIS — I10 Essential (primary) hypertension: Secondary | ICD-10-CM

## 2023-06-08 DIAGNOSIS — E559 Vitamin D deficiency, unspecified: Secondary | ICD-10-CM

## 2023-06-08 DIAGNOSIS — Z0000 Encounter for general adult medical examination without abnormal findings: Secondary | ICD-10-CM | POA: Diagnosis not present

## 2023-06-08 LAB — CBC WITH DIFFERENTIAL/PLATELET
Basophils Absolute: 0 10*3/uL (ref 0.0–0.1)
Basophils Relative: 0.6 % (ref 0.0–3.0)
Eosinophils Absolute: 0.1 10*3/uL (ref 0.0–0.7)
Eosinophils Relative: 1.3 % (ref 0.0–5.0)
HCT: 42.4 % (ref 36.0–46.0)
Hemoglobin: 14 g/dL (ref 12.0–15.0)
Lymphocytes Relative: 28.5 % (ref 12.0–46.0)
Lymphs Abs: 1.8 10*3/uL (ref 0.7–4.0)
MCHC: 33 g/dL (ref 30.0–36.0)
MCV: 94 fL (ref 78.0–100.0)
Monocytes Absolute: 0.8 10*3/uL (ref 0.1–1.0)
Monocytes Relative: 12.4 % — ABNORMAL HIGH (ref 3.0–12.0)
Neutro Abs: 3.6 10*3/uL (ref 1.4–7.7)
Neutrophils Relative %: 57.2 % (ref 43.0–77.0)
Platelets: 227 10*3/uL (ref 150.0–400.0)
RBC: 4.51 Mil/uL (ref 3.87–5.11)
RDW: 13.5 % (ref 11.5–15.5)
WBC: 6.4 10*3/uL (ref 4.0–10.5)

## 2023-06-08 LAB — HEPATIC FUNCTION PANEL
ALT: 16 U/L (ref 0–35)
AST: 23 U/L (ref 0–37)
Albumin: 3.9 g/dL (ref 3.5–5.2)
Alkaline Phosphatase: 87 U/L (ref 39–117)
Bilirubin, Direct: 0.1 mg/dL (ref 0.0–0.3)
Total Bilirubin: 0.6 mg/dL (ref 0.2–1.2)
Total Protein: 7.5 g/dL (ref 6.0–8.3)

## 2023-06-08 LAB — BASIC METABOLIC PANEL
BUN: 10 mg/dL (ref 6–23)
CO2: 33 meq/L — ABNORMAL HIGH (ref 19–32)
Calcium: 8.6 mg/dL (ref 8.4–10.5)
Chloride: 102 meq/L (ref 96–112)
Creatinine, Ser: 0.58 mg/dL (ref 0.40–1.20)
GFR: 93.72 mL/min (ref >60.00)
Glucose, Bld: 84 mg/dL (ref 70–99)
Potassium: 3.3 meq/L — ABNORMAL LOW (ref 3.5–5.1)
Sodium: 141 meq/L (ref 135–145)

## 2023-06-08 LAB — URINALYSIS, ROUTINE W REFLEX MICROSCOPIC
Bilirubin Urine: NEGATIVE
Hgb urine dipstick: NEGATIVE
Ketones, ur: NEGATIVE
Leukocytes,Ua: NEGATIVE
Nitrite: NEGATIVE
Specific Gravity, Urine: 1.02 (ref 1.000–1.030)
Total Protein, Urine: NEGATIVE
Urine Glucose: NEGATIVE
Urobilinogen, UA: 0.2 (ref 0.0–1.0)
pH: 6.5 (ref 5.0–8.0)

## 2023-06-08 LAB — LIPID PANEL
Cholesterol: 194 mg/dL (ref 0–200)
HDL: 72.3 mg/dL (ref >39.00)
LDL Cholesterol: 106 mg/dL — ABNORMAL HIGH (ref 0–99)
NonHDL: 121.32
Total CHOL/HDL Ratio: 3
Triglycerides: 78 mg/dL (ref 0.0–149.0)
VLDL: 15.6 mg/dL (ref 0.0–40.0)

## 2023-06-08 LAB — VITAMIN D 25 HYDROXY (VIT D DEFICIENCY, FRACTURES): VITD: 24.2 ng/mL — ABNORMAL LOW (ref 30.00–100.00)

## 2023-06-08 LAB — MAGNESIUM: Magnesium: 1.6 mg/dL (ref 1.5–2.5)

## 2023-06-08 LAB — VITAMIN B12: Vitamin B-12: 226 pg/mL (ref 211–911)

## 2023-06-08 LAB — TSH: TSH: 2.62 u[IU]/mL (ref 0.35–5.50)

## 2023-06-08 LAB — HEMOGLOBIN A1C: Hgb A1c MFr Bld: 5.4 % (ref 4.6–6.5)

## 2023-06-08 MED ORDER — TELMISARTAN 80 MG PO TABS: 80.0000 mg | ORAL_TABLET | Freq: Every day | ORAL | 3 refills | Status: DC

## 2023-06-08 MED ORDER — HYDROCHLOROTHIAZIDE 12.5 MG PO CAPS: 12.5000 mg | ORAL_CAPSULE | Freq: Every day | ORAL | 3 refills | Status: AC

## 2023-06-08 MED ORDER — ROSUVASTATIN CALCIUM 10 MG PO TABS: 10.0000 mg | ORAL_TABLET | Freq: Every day | ORAL | 3 refills | Status: DC

## 2023-06-08 MED ORDER — AMLODIPINE BESYLATE 5 MG PO TABS: 5.0000 mg | ORAL_TABLET | Freq: Every day | ORAL | 3 refills | Status: DC

## 2023-06-08 MED ORDER — ROSUVASTATIN CALCIUM 5 MG PO TABS: 5.0000 mg | ORAL_TABLET | Freq: Every day | ORAL | 3 refills | Status: DC

## 2023-06-08 MED ORDER — POTASSIUM CHLORIDE ER 10 MEQ PO TBCR: 10.0000 meq | EXTENDED_RELEASE_TABLET | Freq: Every day | ORAL | 3 refills | Status: AC

## 2023-06-08 NOTE — Patient Instructions (Signed)
You had the flu shot today  Please take all new medication as prescribed= - the HCT 12.5 mg per day  Please continue all other medications as before, and refills have been done if requested.  Please have the pharmacy call with any other refills you may need.  Please continue your efforts at being more active, low cholesterol diet, and weight control.  You are otherwise up to date with prevention measures today.  Please keep your appointments with your specialists as you may have planned  Please go to the LAB at the blood drawing area for the tests to be done  You will be contacted by phone if any changes need to be made immediately.  Otherwise, you will receive a letter about your results with an explanation, but please check with MyChart first.  Please make an Appointment to return in 6 months, or sooner if needed
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
## 2023-06-10 ENCOUNTER — Encounter: Payer: Self-pay | Admitting: Internal Medicine

## 2023-06-10 NOTE — Assessment & Plan Note (Signed)
Lab Results  Component Value Date   LDLCALC 106 (H) 06/08/2023   uncontrolled, pt to increase the crestor from 5 to 10 mg per day

## 2023-06-10 NOTE — Assessment & Plan Note (Signed)
Lab Results  Component Value Date   HGBA1C 5.4 06/08/2023   Stable, pt to continue current medical treatment  - diet, wt control

## 2023-06-10 NOTE — Assessment & Plan Note (Signed)
Also for magnesium with labs,  to f/u any worsening symptoms or concerns

## 2023-06-10 NOTE — Assessment & Plan Note (Signed)
Last vitamin D Lab Results  Component Value Date   VD25OH 24.20 (L) 06/08/2023   Low, to start oral replacement

## 2023-06-10 NOTE — Assessment & Plan Note (Addendum)
Age and sex appropriate education and counseling updated with regular exercise and diet Referrals for preventative services - none needed Immunizations addressed - declines prevnar, shingrix, covid today  - for flu shot today Smoking counseling  - none needed Evidence for depression or other mood disorder - none significant Most recent labs reviewed. I have personally reviewed and have noted: 1) the patient's medical and social history 2) The patient's current medications and supplements 3) The patient's height, weight, and BMI have been recorded in the chart

## 2023-06-10 NOTE — Assessment & Plan Note (Signed)
BP Readings from Last 3 Encounters:  06/08/23 (!) 142/86  05/02/23 (!) 150/86  01/09/23 (!) 162/74   uncontrolled, pt to continue medical treatment norvasc 5 every day, micardis 80 every day, and add hct 12.5 qd

## 2023-06-10 NOTE — Assessment & Plan Note (Signed)
Pt counsled to quit, pt not ready °

## 2023-06-25 ENCOUNTER — Other Ambulatory Visit: Payer: Self-pay | Admitting: Obstetrics and Gynecology

## 2023-06-25 DIAGNOSIS — N6310 Unspecified lump in the right breast, unspecified quadrant: Secondary | ICD-10-CM

## 2023-07-31 ENCOUNTER — Ambulatory Visit
Admission: RE | Admit: 2023-07-31 | Discharge: 2023-07-31 | Disposition: A | Discharge status: Home / Self Care | Payer: 59 | Source: Ambulatory Visit | Attending: Obstetrics and Gynecology | Admitting: Obstetrics and Gynecology

## 2023-07-31 ENCOUNTER — Ambulatory Visit
Admission: RE | Admit: 2023-07-31 | Discharge: 2023-07-31 | Disposition: A | Discharge status: Home / Self Care | Payer: BC Managed Care – PPO | Source: Ambulatory Visit | Attending: Obstetrics and Gynecology | Admitting: Obstetrics and Gynecology

## 2023-07-31 DIAGNOSIS — N6310 Unspecified lump in the right breast, unspecified quadrant: Secondary | ICD-10-CM

## 2023-08-29 ENCOUNTER — Ambulatory Visit (INDEPENDENT_AMBULATORY_CARE_PROVIDER_SITE_OTHER): Payer: 59 | Admitting: Internal Medicine

## 2023-08-29 ENCOUNTER — Encounter: Payer: Self-pay | Admitting: Internal Medicine

## 2023-08-29 VITALS — BP 118/68 | HR 80 | Temp 98.2°F | Ht 64.0 in | Wt 249.0 lb

## 2023-08-29 DIAGNOSIS — I10 Essential (primary) hypertension: Secondary | ICD-10-CM

## 2023-08-29 DIAGNOSIS — J069 Acute upper respiratory infection, unspecified: Secondary | ICD-10-CM | POA: Diagnosis not present

## 2023-08-29 DIAGNOSIS — M79602 Pain in left arm: Secondary | ICD-10-CM

## 2023-08-29 DIAGNOSIS — M79601 Pain in right arm: Secondary | ICD-10-CM | POA: Insufficient documentation

## 2023-08-29 DIAGNOSIS — E559 Vitamin D deficiency, unspecified: Secondary | ICD-10-CM

## 2023-08-29 DIAGNOSIS — R6889 Other general symptoms and signs: Secondary | ICD-10-CM

## 2023-08-29 DIAGNOSIS — R739 Hyperglycemia, unspecified: Secondary | ICD-10-CM | POA: Diagnosis not present

## 2023-08-29 LAB — POC COVID19 BINAXNOW: SARS Coronavirus 2 Ag: NEGATIVE

## 2023-08-29 LAB — POCT INFLUENZA A/B
Influenza A, POC: NEGATIVE
Influenza B, POC: NEGATIVE

## 2023-08-29 LAB — POCT RESPIRATORY SYNCYTIAL VIRUS: RSV Rapid Ag: NEGATIVE

## 2023-08-29 MED ORDER — AZITHROMYCIN 250 MG PO TABS: ORAL_TABLET | ORAL | 1 refills | Status: AC
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
## 2023-08-29 NOTE — Patient Instructions (Signed)
 Please take all new medication as prescribed - the antibiotic  Ok to also try the OTC Voltaren gel topically to the upper arms places of pain and soreness  Please continue all other medications as before, and refills have been done if requested.  Please have the pharmacy call with any other refills you may need.  Please keep your appointments with your specialists as you may have planned  Please see Sports Medicine if the arm pain is not improved

## 2023-09-02 ENCOUNTER — Encounter: Payer: Self-pay | Admitting: Internal Medicine

## 2023-09-02 NOTE — Assessment & Plan Note (Signed)
Mild to mod, for antibx course zpack asd,,  to f/u any worsening symptoms or concerns

## 2023-09-02 NOTE — Assessment & Plan Note (Signed)
 Last vitamin D Lab Results  Component Value Date   VD25OH 24.20 (L) 06/08/2023   Low, to start oral replacement

## 2023-09-02 NOTE — Assessment & Plan Note (Signed)
 Lab Results  Component Value Date   HGBA1C 5.4 06/08/2023   Stable, pt to continue current medical treatment  - diet, wt control

## 2023-09-02 NOTE — Assessment & Plan Note (Signed)
 C/w bilateral deltoid tendonitis - for topical voltaren gel prn, f/u sports medicine if not improved

## 2023-09-02 NOTE — Assessment & Plan Note (Signed)
 BP Readings from Last 3 Encounters:  08/29/23 118/68  06/08/23 (!) 142/86  05/02/23 (!) 150/86   Stable, pt to continue medical treatment norvsc 5 every day, hct 12.5 every day, micardis  80 qd

## 2023-09-06 ENCOUNTER — Ambulatory Visit (INDEPENDENT_AMBULATORY_CARE_PROVIDER_SITE_OTHER): Payer: 59

## 2023-09-06 ENCOUNTER — Ambulatory Visit (HOSPITAL_COMMUNITY)
Admission: EM | Admit: 2023-09-06 | Discharge: 2023-09-06 | Disposition: A | Discharge status: Home / Self Care | Payer: 59 | Attending: Internal Medicine | Admitting: Internal Medicine

## 2023-09-06 ENCOUNTER — Ambulatory Visit: Payer: Self-pay | Admitting: Internal Medicine

## 2023-09-06 ENCOUNTER — Encounter (HOSPITAL_COMMUNITY): Payer: Self-pay | Admitting: Internal Medicine

## 2023-09-06 DIAGNOSIS — R0602 Shortness of breath: Secondary | ICD-10-CM

## 2023-09-06 DIAGNOSIS — R051 Acute cough: Secondary | ICD-10-CM
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
## 2023-09-06 NOTE — ED Triage Notes (Signed)
Patient here today with c/o chills, SOB, sneeze, and headache since Sunday. Patient had an URI a week prior and was on antibiotics.

## 2023-09-06 NOTE — Discharge Instructions (Signed)
Please follow up with your primary care doctor tomorrow, you may need a repeated echocardiogram since your last one was 5 years ago.

## 2023-09-06 NOTE — Telephone Encounter (Signed)
Chief Complaint: worsening URI symptoms Symptoms: shortness of breath, cough, chills, headache Frequency: since last week Pertinent Negatives: Patient denies n/a Disposition: [] ED /[x] Urgent Care (no appt availability in office) / [] Appointment(In office/virtual)/ []  [facility]/ [] Home Care/ [] Refused Recommended Disposition /[] [place] [facility]/ []  Follow-up with PCP Additional Notes: Patient called in stating she was seen in office last week and diagnosed with URI. Patient completed round of abx that were prescribed but states she is not feeling any better, and is feeling like even at rest, she is short of breath. Patient states she has chills and a headache as well. Advised patient to be evaluated at Urgent Care tonight. Patient verbalized understanding and will be going to urgent care tonight.    Copied from CRM [id number]. Topic: Clinical - Red Word Triage >> Sep 06, 2023  5:26 PM Taleah C wrote: Red Word that prompted transfer to Nurse Triage: worsening sxs of covid/pnuemonia, body chills, scale: 6/10. Reason for Disposition  [1] MILD difficulty breathing (e.g., minimal/no SOB at rest, SOB with walking, pulse <100) AND [2] NEW-onset or WORSE than normal  Answer Assessment - Initial Assessment Questions 1. RESPIRATORY STATUS: "Describe your breathing?" (e.g., wheezing, shortness of breath, unable to speak, severe coughing)      Shortness of breath 2. ONSET: "When did this breathing problem begin?"      Last week 3. PATTERN "Does the difficult breathing come and go, or has it been constant since it started?"      constant 4. SEVERITY: "How bad is your breathing?" (e.g., mild, moderate, severe)    - MILD: No SOB at rest, mild SOB with walking, speaks normally in sentences, can lie down, no retractions, pulse < 100.    - MODERATE: SOB at rest, SOB with minimal exertion and prefers to sit, cannot lie down flat, speaks in phrases, mild retractions, audible wheezing, pulse  100-120.    - SEVERE: Very SOB at rest, speaks in single words, struggling to breathe, sitting hunched forward, retractions, pulse > 120      Moderate - when sitting there 5. RECURRENT SYMPTOM: "Have you had difficulty breathing before?" If Yes, ask: "When was the last time?" and "What happened that time?"      No 6. CARDIAC HISTORY: "Do you have any history of heart disease?" (e.g., heart attack, angina, bypass surgery, angioplasty)      No 7. LUNG HISTORY: "Do you have any history of lung disease?"  (e.g., pulmonary embolus, asthma, emphysema)     No 8. CAUSE: "What do you think is causing the breathing problem?"      From URI 9. OTHER SYMPTOMS: "Do you have any other symptoms? (e.g., dizziness, runny nose, cough, chest pain, fever)     Sneezing, chills, headache, coughing, shortness of breath 10. O2 SATURATION MONITOR:  "Do you use an oxygen saturation monitor (pulse oximeter) at home?" If Yes, ask: "What is your reading (oxygen level) today?" "What is your usual oxygen saturation reading?" (e.g., 95%)       Unable to check  Protocols used: Breathing Difficulty-A-AH

## 2023-09-11 ENCOUNTER — Telehealth: Payer: Self-pay | Admitting: Internal Medicine

## 2023-09-11 NOTE — Telephone Encounter (Signed)
Copied from CRM [id number]. Topic: General - Other >> Sep 11, 2023 10:10 AM Deaijah H wrote: Reason for CRM: Patient called in stating she's Needing a doctor's note stating she was there 08/29/23 for work for 02/5-02/07 / please call once ready [phone]

## 2023-09-12 NOTE — Telephone Encounter (Signed)
Ok I sent per [place]. So hopefully that will work out

## 2024-03-10 ENCOUNTER — Other Ambulatory Visit: Payer: Self-pay | Admitting: Internal Medicine

## 2024-04-18 ENCOUNTER — Encounter: Payer: Self-pay | Admitting: Family Medicine

## 2024-04-18 ENCOUNTER — Other Ambulatory Visit (HOSPITAL_COMMUNITY)
Admission: RE | Admit: 2024-04-18 | Discharge: 2024-04-18 | Disposition: A | Discharge status: Home / Self Care | Source: Ambulatory Visit | Attending: Family Medicine | Admitting: Family Medicine

## 2024-04-18 ENCOUNTER — Ambulatory Visit: Admitting: Family Medicine

## 2024-04-18 VITALS — BP 140/80 | HR 70 | Temp 98.3°F | Ht 64.0 in | Wt 270.4 lb

## 2024-04-18 DIAGNOSIS — I10 Essential (primary) hypertension: Secondary | ICD-10-CM

## 2024-04-18 DIAGNOSIS — E7800 Pure hypercholesterolemia, unspecified: Secondary | ICD-10-CM

## 2024-04-18 DIAGNOSIS — K5900 Constipation, unspecified: Secondary | ICD-10-CM

## 2024-04-18 DIAGNOSIS — E559 Vitamin D deficiency, unspecified: Secondary | ICD-10-CM

## 2024-04-18 DIAGNOSIS — Z23 Encounter for immunization: Secondary | ICD-10-CM

## 2024-04-18 DIAGNOSIS — R300 Dysuria: Secondary | ICD-10-CM

## 2024-04-18 DIAGNOSIS — B3731 Acute candidiasis of vulva and vagina: Secondary | ICD-10-CM | POA: Diagnosis not present

## 2024-04-18 DIAGNOSIS — N898 Other specified noninflammatory disorders of vagina: Secondary | ICD-10-CM | POA: Diagnosis present

## 2024-04-18 DIAGNOSIS — R609 Edema, unspecified: Secondary | ICD-10-CM

## 2024-04-18 DIAGNOSIS — Z79899 Other long term (current) drug therapy: Secondary | ICD-10-CM

## 2024-04-18 DIAGNOSIS — R739 Hyperglycemia, unspecified: Secondary | ICD-10-CM

## 2024-04-18 DIAGNOSIS — E538 Deficiency of other specified B group vitamins: Secondary | ICD-10-CM

## 2024-04-18 LAB — POCT URINALYSIS DIP (CLINITEK)
Bilirubin, UA: NEGATIVE
Blood, UA: NEGATIVE
Glucose, UA: NEGATIVE mg/dL
Ketones, POC UA: NEGATIVE mg/dL
Leukocytes, UA: NEGATIVE
Nitrite, UA: NEGATIVE
Spec Grav, UA: >=1.03 — AB (ref 1.010–1.025)
Urobilinogen, UA: 0.2 U/dL
pH, UA: 6 (ref 5.0–8.0)

## 2024-04-18 LAB — VITAMIN B12: Vitamin B-12: 230 pg/mL (ref 211–911)

## 2024-04-18 LAB — CBC WITH DIFFERENTIAL/PLATELET
Basophils Absolute: 0 K/uL (ref 0.0–0.1)
Basophils Relative: 0.6 % (ref 0.0–3.0)
Eosinophils Absolute: 0.1 K/uL (ref 0.0–0.7)
Eosinophils Relative: 2 % (ref 0.0–5.0)
HCT: 40.5 % (ref 36.0–46.0)
Hemoglobin: 13.5 g/dL (ref 12.0–15.0)
Lymphocytes Relative: 28.5 % (ref 12.0–46.0)
Lymphs Abs: 1.7 K/uL (ref 0.7–4.0)
MCHC: 33.3 g/dL (ref 30.0–36.0)
MCV: 91.6 fl (ref 78.0–100.0)
Monocytes Absolute: 0.5 K/uL (ref 0.1–1.0)
Monocytes Relative: 8.7 % (ref 3.0–12.0)
Neutro Abs: 3.7 K/uL (ref 1.4–7.7)
Neutrophils Relative %: 60.2 % (ref 43.0–77.0)
Platelets: 209 K/uL (ref 150.0–400.0)
RBC: 4.42 Mil/uL (ref 3.87–5.11)
RDW: 13.6 % (ref 11.5–15.5)
WBC: 6.1 K/uL (ref 4.0–10.5)

## 2024-04-18 LAB — LIPID PANEL
Cholesterol: 142 mg/dL (ref 0–200)
HDL: 60.9 mg/dL (ref >39.00)
LDL Cholesterol: 72 mg/dL (ref 0–99)
NonHDL: 80.9
Total CHOL/HDL Ratio: 2
Triglycerides: 46 mg/dL (ref 0.0–149.0)
VLDL: 9.2 mg/dL (ref 0.0–40.0)

## 2024-04-18 LAB — COMPREHENSIVE METABOLIC PANEL WITH GFR
ALT: 10 U/L (ref 0–35)
AST: 18 U/L (ref 0–37)
Albumin: 3.9 g/dL (ref 3.5–5.2)
Alkaline Phosphatase: 73 U/L (ref 39–117)
BUN: 10 mg/dL (ref 6–23)
CO2: 30 meq/L (ref 19–32)
Calcium: 8.6 mg/dL (ref 8.4–10.5)
Chloride: 108 meq/L (ref 96–112)
Creatinine, Ser: 0.56 mg/dL (ref 0.40–1.20)
GFR: 93.94 mL/min (ref >60.00)
Glucose, Bld: 84 mg/dL (ref 70–99)
Potassium: 3.4 meq/L — ABNORMAL LOW (ref 3.5–5.1)
Sodium: 143 meq/L (ref 135–145)
Total Bilirubin: 0.7 mg/dL (ref 0.2–1.2)
Total Protein: 7.2 g/dL (ref 6.0–8.3)

## 2024-04-18 LAB — HEMOGLOBIN A1C: Hgb A1c MFr Bld: 5.7 % (ref 4.6–6.5)

## 2024-04-18 LAB — VITAMIN D 25 HYDROXY (VIT D DEFICIENCY, FRACTURES): VITD: 20.63 ng/mL — ABNORMAL LOW (ref 30.00–100.00)

## 2024-04-18 NOTE — Patient Instructions (Addendum)
 We have sent your urine to the lab today for culture.   We have sent in the swab to the lab today. We will be in contact with results once they are received.   We are checking labs today, will be in contact with any results that require further attention  We have given your flu vaccine today.   Follow-up with me for new or worsening symptoms.

## 2024-04-18 NOTE — Progress Notes (Signed)
 Acute Office Visit  Subjective:     Patient ID: [name] [name], female    DOB: 11-11-1955, 68 y.o.   MRN: [id number]  Chief Complaint  Patient presents with   Acute Visit    Ongoing for about 1 week. Vaginal itching, odor, and irritation. Has been using different soaps, knows she is probably dehydrated. Has had low back pains, tried vagisil itch cream    HPI  Discussed the use of AI scribe software for clinical note transcription with the patient, who gave verbal consent to proceed.  History of Present Illness [name] [name] is a 68 year old female who presents with abdominal discomfort and bloating.  Gastrointestinal symptoms - Abdominal discomfort and bloating present - Constipation and reflux with sensation of digestive backup - Symptoms began after initiating a drink regimen consisting of water, apple cider vinegar, lemon, salt, and cayenne pepper  Genitourinary symptoms - White vaginal discharge and irritation present with itching  Edema - Recent swelling in hands and ankles - Uses a diuretic as needed for swelling  Blood pressure - Blood pressure today is 140/80, elevated from her usual 120/70 - Attributes elevated blood pressure to current symptoms and recent dietary changes     ROS Per HPI      Objective:    BP (!) 140/80 (BP Location: Left Arm, Patient Position: Sitting)   Pulse 70   Temp 98.3 F (36.8 C) (Temporal)   Ht 5' 4 (1.626 m)   Wt 270 lb 6.4 oz (122.7 kg)   SpO2 97%   BMI 46.41 kg/m    Physical Exam Vitals and nursing note reviewed. Exam conducted with a chaperone present Epifania Olden, CMA).  Constitutional:      General: She is not in acute distress.    Appearance: Normal appearance. She is obese.  HENT:     Head: Normocephalic and atraumatic.     Right Ear: External ear normal.     Left Ear: External ear normal.     Nose: Nose normal.     Mouth/Throat:     Mouth: Mucous membranes are moist.     Pharynx: Oropharynx is clear.   Eyes:     Extraocular Movements: Extraocular movements intact.     Pupils: Pupils are equal, round, and reactive to light.  Cardiovascular:     Rate and Rhythm: Normal rate and regular rhythm.     Pulses: Normal pulses.     Heart sounds: Normal heart sounds.  Pulmonary:     Effort: Pulmonary effort is normal. No respiratory distress.     Breath sounds: Normal breath sounds. No wheezing, rhonchi or rales.  Genitourinary:    Comments: Mildly erythematous inner labia with white vaginal discharge, no obvious lesions, tenderness, swelling Musculoskeletal:     Cervical back: Normal range of motion.     Right lower leg: No edema.     Left lower leg: No edema.  Lymphadenopathy:     Cervical: No cervical adenopathy.  Neurological:     General: No focal deficit present.     Mental Status: She is alert and oriented to person, place, and time.  Psychiatric:        Mood and Affect: Mood normal.        Thought Content: Thought content normal.     Results for orders placed or performed in visit on 04/18/24  CBC with Differential/Platelet  Result Value Ref Range   WBC 6.1 4.0 - 10.5 K/uL   RBC 4.42 3.87 -  5.11 Mil/uL   Hemoglobin 13.5 12.0 - 15.0 g/dL   HCT 59.4 63.9 - 53.9 %   MCV 91.6 78.0 - 100.0 fl   MCHC 33.3 30.0 - 36.0 g/dL   RDW 86.3 88.4 - 84.4 %   Platelets 209.0 150.0 - 400.0 K/uL   Neutrophils Relative % 60.2 43.0 - 77.0 %   Lymphocytes Relative 28.5 12.0 - 46.0 %   Monocytes Relative 8.7 3.0 - 12.0 %   Eosinophils Relative 2.0 0.0 - 5.0 %   Basophils Relative 0.6 0.0 - 3.0 %   Neutro Abs 3.7 1.4 - 7.7 K/uL   Lymphs Abs 1.7 0.7 - 4.0 K/uL   Monocytes Absolute 0.5 0.1 - 1.0 K/uL   Eosinophils Absolute 0.1 0.0 - 0.7 K/uL   Basophils Absolute 0.0 0.0 - 0.1 K/uL  Comprehensive metabolic panel with GFR  Result Value Ref Range   Sodium 143 135 - 145 mEq/L   Potassium 3.4 (L) 3.5 - 5.1 mEq/L   Chloride 108 96 - 112 mEq/L   CO2 30 19 - 32 mEq/L   Glucose, Bld 84 70 - 99  mg/dL   BUN 10 6 - 23 mg/dL   Creatinine, Ser 9.43 0.40 - 1.20 mg/dL   Total Bilirubin 0.7 0.2 - 1.2 mg/dL   Alkaline Phosphatase 73 39 - 117 U/L   AST 18 0 - 37 U/L   ALT 10 0 - 35 U/L   Total Protein 7.2 6.0 - 8.3 g/dL   Albumin 3.9 3.5 - 5.2 g/dL   GFR 06.05 >39.99 mL/min   Calcium  8.6 8.4 - 10.5 mg/dL  Lipid panel  Result Value Ref Range   Cholesterol 142 0 - 200 mg/dL   Triglycerides 53.9 0.0 - 149.0 mg/dL   HDL 39.09 >60.99 mg/dL   VLDL 9.2 0.0 - 59.9 mg/dL   LDL Cholesterol 72 0 - 99 mg/dL   Total CHOL/HDL Ratio 2    NonHDL 80.90   VITAMIN D  25 Hydroxy (Vit-D Deficiency, Fractures)  Result Value Ref Range   VITD 20.63 (L) 30.00 - 100.00 ng/mL  Vitamin B12  Result Value Ref Range   Vitamin B-12 230 211 - 911 pg/mL  HgB A1c  Result Value Ref Range   Hgb A1c MFr Bld 5.7 4.6 - 6.5 %  POCT URINALYSIS DIP (CLINITEK)  Result Value Ref Range   Color, UA yellow yellow   Clarity, UA clear clear   Glucose, UA negative negative mg/dL   Bilirubin, UA negative negative   Ketones, POC UA negative negative mg/dL   Spec Grav, UA >=8.969 (A) 1.010 - 1.025   Blood, UA negative negative   pH, UA 6.0 5.0 - 8.0   POC PROTEIN,UA trace negative, trace   Urobilinogen, UA 0.2 0.2 or 1.0 E.U./dL   Nitrite, UA Negative Negative   Leukocytes, UA Negative Negative        Assessment & Plan:   Assessment and Plan Assessment & Plan Vulvovaginal candidiasis, vaginal irritation, dysuria White discharge and mild redness suggest yeast infection. Recent acidic drink and hygiene product changes may have altered vaginal pH. - Send swab for culture. - Advise discontinuation of acidic drink mixture. - Recommend use of unscented soap.  Edema Swollen hands and ankles with slightly elevated blood pressure suggest fluid retention. - Advise diuretic use if swelling observed. - Monitor blood pressure.  Essential hypertension Blood pressure at 140/80 mmHg, elevated from usual 120/70 mmHg. -  Continue telmisartan  and amlodipine . - Monitor blood pressure regularly.  Hypercholesterolemia On rosuvastatin  for cholesterol management. - Continue daily rosuvastatin .  Constipation Gastrointestinal discomfort and bloating possibly due to slowed intestinal transit and recent dietary changes. - Encourage increased water intake. - Recommend regular physical activity.  Vitamin D  deficiency - Vitamin D  levels today, continue supplementation  Hyperglycemia - CMP, A1c today - Discussed to decrease sweets and carbs in the diet to help regulate blood sugar  Immunization due - Flu vaccine given today  Medication management - Continue current medication regimen - Labs today, will dose adjust as needed     Orders Placed This Encounter  Procedures   Urine Culture    Standing Status:   Future    Number of Occurrences:   1    Expected Date:   04/18/2024    Expiration Date:   04/18/2025   Flu vaccine HIGH DOSE PF(Fluzone Trivalent)   CBC with Differential/Platelet    Release to patient:   Immediate [1]   Comprehensive metabolic panel with GFR    Release to patient:   Immediate [1]   Lipid panel   VITAMIN D  25 Hydroxy (Vit-D Deficiency, Fractures)   Vitamin B12   HgB A1c   POCT URINALYSIS DIP (CLINITEK)     No orders of the defined types were placed in this encounter.   Return for With PCP as scheduled.  Corean LITTIE Ku, FNP

## 2024-04-19 ENCOUNTER — Ambulatory Visit: Payer: Self-pay | Admitting: Family Medicine

## 2024-04-19 DIAGNOSIS — E559 Vitamin D deficiency, unspecified: Secondary | ICD-10-CM

## 2024-04-19 LAB — URINE CULTURE

## 2024-04-19 MED ORDER — VITAMIN D (ERGOCALCIFEROL) 1.25 MG (50000 UNIT) PO CAPS: 50000.0000 [IU] | ORAL_CAPSULE | ORAL | 0 refills | Status: AC

## 2024-04-21 LAB — CERVICOVAGINAL ANCILLARY ONLY
Bacterial Vaginitis (gardnerella): NEGATIVE
Candida Glabrata: NEGATIVE
Candida Vaginitis: NEGATIVE
Comment: NEGATIVE
Comment: NEGATIVE
Comment: NEGATIVE

## 2024-05-13 ENCOUNTER — Ambulatory Visit: Payer: Self-pay

## 2024-05-13 NOTE — Telephone Encounter (Signed)
 Patient requesting a call back at 3:30 pm as her background is to loud as she is currently at work. Will call patient later to discuss concerns.         Copied from CRM [id number]. Topic: Clinical - Red Word Triage >> May 13, 2024  2:10 PM Berneda FALCON wrote: Red Word that prompted transfer to Nurse Triage: Patient states she saw Corean Ku on 9/26 and was prescribed new medication and feels this was too much.  She is currently on Vitamin D , Ergocalciferol , (DRISDOL ) 1.25 MG (50000 UNIT) CAPS capsule  And feels that there is now new pain in the left arm right below the shoulder on and off for 2 weeks.  She is taking them once per week and this Thursday would be her 3rd one.

## 2024-05-13 NOTE — Telephone Encounter (Signed)
 Returned call to patient, she requests for a call back just after 530pm today due to being at work again. This Clinical research associate let patient know we will attempt to call her again around 530 but that she should call back when she is available. Patient verbalized understanding and will return call.

## 2024-05-13 NOTE — Telephone Encounter (Signed)
 FYI Only or Action Required?: FYI only for provider.  Patient was last seen in primary care on 04/18/2024 by Alvia Corean CROME, FNP.  Called Nurse Triage reporting Arm Pain and Medication Reaction.  Symptoms began several weeks ago.  Interventions attempted: OTC medications: Arthritis muscle rub.  Symptoms are: gradually worsening.  Triage Disposition: Go to ED Now (Notify PCP)  Patient/caregiver understands and will follow disposition?: Yes  Reason for Disposition  Pain also in shoulder(s) or arm(s) or jaw  (Exception: Pain is clearly made worse by movement.)  Answer Assessment - Initial Assessment Questions Pt reports onset of severe pain in left arm and intermittent moderate numbness and tingling in left hand 3 weeks ago, as well as intermittent moderate CP 1 week ago. Denies SOB, nausea, dizziness or sweating. Concerned it may be r/t the high dose of vitamin D  she was prescribed to take weekly on 9/27. Advised ED, pt reports she will be able to find someone that can drive her, or would be able to drive herself. Not currently experiencing CP and feels ok to drive. Encouraged to find someone to drive pt first.   1. ONSET: When did the pain start?     3 weeks ago  2. LOCATION: Where is the pain located?     Left bicep muscle area. Not in the shoulder.  3. PAIN: How bad is the pain? (Scale 1-10; or mild, moderate, severe)     8/10. Tried an arthritis muscle rub which has been helpful.  4. WORK OR EXERCISE: Has there been any recent work or exercise that involved this part of the body?     No  5. CAUSE: What do you think is causing the shoulder pain?     Vitamin D  50000 unit started 9/27. Concerned this is too high of a dose and is causing symptoms.   6. OTHER SYMPTOMS: Do you have any other symptoms? (e.g., neck pain, swelling, rash, fever, numbness, weakness)     Intermittent moderate numbness that comes and goes in left hand, wakes up with movement. Moves it and  shakes it awake. No other numbness or tingling or changes to speech or vision. Reports onset of 6/10 left sided CP last week that comes and goes with exertion and periods of stress.  7. PREGNANCY: Is there any chance you are pregnant? When was your last menstrual period?     No  Answer Assessment - Initial Assessment Questions 1. LOCATION: Where does it hurt?       Left side of chest  2. RADIATION: Does the pain go anywhere else? (e.g., into neck, jaw, arms, back)     Left arm  3. ONSET: When did the chest pain begin? (Minutes, hours or days)      1 week ago  4. PATTERN: Does the pain come and go, or has it been constant since it started?  Does it get worse with exertion?      Comes and goes. Happens once every few days. Worse with exertion and periods of stress, also when pt puts off taking meds in the morning to the end of the day. Sits down to relax and the pain resolves. Educated to stay on regular schedule with meds.  5. DURATION: How long does it last (e.g., seconds, minutes, hours)     3-5 minutes  6. SEVERITY: How bad is the pain?  (e.g., Scale 1-10; mild, moderate, or severe)     6/10   7. CARDIAC RISK FACTORS: Do you have  any history of heart problems or risk factors for heart disease? (e.g., angina, prior heart attack; diabetes, high blood pressure, high cholesterol, smoker, or strong family history of heart disease)     Htn.   8. PULMONARY RISK FACTORS: Do you have any history of lung disease?  (e.g., blood clots in lung, asthma, emphysema, birth control pills)     No  9. CAUSE: What do you think is causing the chest pain?     Stress and exertion. Resolves with rest. Weight gain of 100 lbs over past year.  10. OTHER SYMPTOMS: Do you have any other symptoms? (e.g., dizziness, nausea, vomiting, sweating, fever, difficulty breathing, cough)       No  11. PREGNANCY: Is there any chance you are pregnant? When was your last menstrual period?        No  Protocols used: Shoulder Pain-A-AH, Chest Pain-A-AH

## 2024-05-14 NOTE — Progress Notes (Unsigned)
    Subjective:    Patient ID: [name] [name], female    DOB: [birth year], 68 y.o.   MRN: [id number]      HPI [name] [name] is here for No chief complaint on file.        Medications and allergies reviewed with patient and updated if appropriate.  Current Outpatient Medications on File Prior to Visit  Medication Sig Dispense Refill   amLODipine  (NORVASC ) 5 MG tablet TAKE 1 TABLET(5 MG) BY MOUTH DAILY 90 tablet 1   hydrochlorothiazide  (MICROZIDE ) 12.5 MG capsule Take 1 capsule (12.5 mg total) by mouth daily. 90 capsule 3   Multiple Vitamins-Minerals (CENTRUM SILVER PO) Take 1 tablet by mouth daily.     potassium chloride  (KLOR-CON  10) 10 MEQ tablet Take 1 tablet (10 mEq total) by mouth daily. 90 tablet 3   rosuvastatin  (CRESTOR ) 10 MG tablet Take 1 tablet (10 mg total) by mouth daily. 90 tablet 3   telmisartan  (MICARDIS ) 80 MG tablet Take 1 tablet (80 mg total) by mouth daily. 90 tablet 3   Vitamin D , Ergocalciferol , (DRISDOL ) 1.25 MG (50000 UNIT) CAPS capsule Take 1 capsule (50,000 Units total) by mouth every 7 (seven) days. 8 capsule 0   No current facility-administered medications on file prior to visit.    Review of Systems     Objective:  There were no vitals filed for this visit. BP Readings from Last 3 Encounters:  04/18/24 (!) 140/80  09/06/23 (!) 158/89  08/29/23 118/68   Wt Readings from Last 3 Encounters:  04/18/24 270 lb 6.4 oz (122.7 kg)  09/06/23 246 lb (111.6 kg)  08/29/23 249 lb (112.9 kg)   There is no height or weight on file to calculate BMI.    Physical Exam         Assessment & Plan:    See Problem List for Assessment and Plan of chronic medical problems.

## 2024-05-14 NOTE — Patient Instructions (Incomplete)
     Make an appointment with sports medicine downstairs - Left shoulder pain, dec ROM, tingling in left hand   Try a wrist brace at nighttime.    Medications changes include :   None   A referral was ordered for sports medicine and someone will call you to schedule an appointment.

## 2024-05-15 ENCOUNTER — Encounter: Payer: Self-pay | Admitting: Internal Medicine

## 2024-05-15 ENCOUNTER — Ambulatory Visit: Admitting: Internal Medicine

## 2024-05-15 VITALS — BP 132/78 | HR 72 | Temp 98.0°F | Ht 64.0 in | Wt 269.0 lb

## 2024-05-15 DIAGNOSIS — E559 Vitamin D deficiency, unspecified: Secondary | ICD-10-CM | POA: Diagnosis not present

## 2024-05-15 DIAGNOSIS — R202 Paresthesia of skin: Secondary | ICD-10-CM | POA: Diagnosis not present

## 2024-05-15 DIAGNOSIS — M25512 Pain in left shoulder: Secondary | ICD-10-CM | POA: Diagnosis not present

## 2024-05-15 DIAGNOSIS — I10 Essential (primary) hypertension: Secondary | ICD-10-CM

## 2024-05-15 DIAGNOSIS — G8929 Other chronic pain: Secondary | ICD-10-CM

## 2024-05-26 ENCOUNTER — Other Ambulatory Visit: Payer: Self-pay

## 2024-05-26 ENCOUNTER — Ambulatory Visit (INDEPENDENT_AMBULATORY_CARE_PROVIDER_SITE_OTHER)

## 2024-05-26 ENCOUNTER — Ambulatory Visit: Admitting: Family Medicine

## 2024-05-26 VITALS — BP 132/88 | HR 78 | Ht 64.0 in | Wt 271.0 lb

## 2024-05-26 DIAGNOSIS — G8929 Other chronic pain: Secondary | ICD-10-CM

## 2024-05-26 DIAGNOSIS — M25512 Pain in left shoulder: Secondary | ICD-10-CM | POA: Diagnosis not present

## 2024-05-26 DIAGNOSIS — M5412 Radiculopathy, cervical region: Secondary | ICD-10-CM

## 2024-05-26 NOTE — Patient Instructions (Addendum)
 Thank you for coming in today.   Please get an Xray today before you leave   I've referred you to Physical Therapy here, at this office.  You will hear from our office soon about scheduling, once we check with your insurance company.   Check back in 2 months

## 2024-05-26 NOTE — Progress Notes (Signed)
   LILLETTE Ileana Collet, PhD, LAT, ATC acting as a scribe for Artist Lloyd, MD.  [name] [name] is a 68 y.o. female who presents to [facility] at [facility] today for L shoulder pain x 6 months. Pt locates pain to the superior aspect of her L shoulder and mid-humerus. No neck pain. She notes paresthesia through her entire L hand. Pain is waking her up at night.   Radiates: yes Aggravates: IR Treatments tried: creams,   Pertinent review of systems: No fevers or chills  Relevant historical information: Smoker.  Carpal tunnel syndrome.   Exam:  BP 132/88   Pulse 78   Ht 5' 4 (1.626 m)   Wt 271 lb (122.9 kg)   SpO2 98%   BMI 46.52 kg/m  General: Well Developed, well nourished, and in no acute distress.   MSK: C-spine: Normal appearing normal cervical motion. Strength intact except noted below.  Left shoulder: Normal-appearing Reduced range of motion lacks functional internal range of motion beyond posterior iliac crest. Strength reduced abduction and external rotation.  Intact internal rotation. Positive Hawkins and Neer's test. Negative Yergason's and speeds test.  Left wrist normal-appearing Negative Tinel's.  Mildly positive Phalen's test.  Lab and Radiology Results  X-ray images left shoulder and C-spine obtained today personally and independently interpreted.  C-spine: Loss of cervical lordosis indicating spasm.  No acute fractures no severe degenerative changes.  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  No acute fractures are visible.  Await formal radiology review    Assessment and Plan: 68 y.o. female with chronic left shoulder and upper arm pain.  Based on physical exam pain due to impingement and rotator cuff tendinopathy.  Plan for physical therapy and recheck in about 8 weeks  Patient is worried that this may be cardiac.  That is possible but much less likely.  She had a pretty good cardiac assessment relatively recently with a  stress test that was reassuring in 2024. However if worsening certainly more could be done.  Consider referral to cardiology in the future.   PDMP not reviewed this encounter. Orders Placed This Encounter  Procedures   DG Shoulder Left    Standing Status:   Future    Number of Occurrences:   1    Expiration Date:   05/26/2025    Reason for Exam (SYMPTOM  OR DIAGNOSIS REQUIRED):   left shoulder pain    Preferred imaging location?:   [facility]   DG Cervical Spine 2 or 3 views    Standing Status:   Future    Number of Occurrences:   1    Expiration Date:   05/26/2025    Reason for Exam (SYMPTOM  OR DIAGNOSIS REQUIRED):   radicular pain    Preferred imaging location?:   [facility]   Ambulatory referral to Physical Therapy    Referral Priority:   Routine    Referral Type:   Physical Medicine    Referral Reason:   Specialty Services Required    Requested Specialty:   Physical Therapy    Number of Visits Requested:   1   No orders of the defined types were placed in this encounter.    Discussed warning signs or symptoms. Please see discharge instructions. Patient expresses understanding.   The above documentation has been reviewed and is accurate and complete Artist Lloyd, M.D.

## 2024-05-29 ENCOUNTER — Ambulatory Visit: Payer: Self-pay | Admitting: Family Medicine

## 2024-05-29 NOTE — Progress Notes (Signed)
Cervical spine x-ray shows a little bit of arthritis.

## 2024-05-29 NOTE — Progress Notes (Signed)
Left shoulder x-ray shows mild arthritis

## 2024-06-30 ENCOUNTER — Other Ambulatory Visit: Payer: Self-pay | Admitting: Internal Medicine

## 2024-06-30 ENCOUNTER — Other Ambulatory Visit: Payer: Self-pay

## 2024-06-30 DIAGNOSIS — I10 Essential (primary) hypertension: Secondary | ICD-10-CM

## 2024-07-28 ENCOUNTER — Ambulatory Visit: Admitting: Family Medicine

## 2024-07-28 NOTE — Progress Notes (Deleted)
"       ° °  LILLETTE Ileana Collet, PhD, LAT, ATC acting as a scribe for Artist Lloyd, MD.  KYARRA VANCAMP is a 69 y.o. female who presents to [facility] at [facility] today for 35-month f/u L shoulder pain. Pt was last seen by Dr. Lloyd on 05/26/24 and was referred to PT, but no visits were scheduled.  Today, pt reports ***  Dx imaging: 05/26/24 C-spine & L shoulder XR  Pertinent review of systems: ***  Relevant historical information: ***   Exam:  There were no vitals taken for this visit. General: Well Developed, well nourished, and in no acute distress.   MSK: ***    Lab and Radiology Results No results found for this or any previous visit (from the past 72 hours). No results found.     Assessment and Plan: 69 y.o. female with ***   PDMP not reviewed this encounter. No orders of the defined types were placed in this encounter.  No orders of the defined types were placed in this encounter.    Discussed warning signs or symptoms. Please see discharge instructions. Patient expresses understanding.   ***  "

## 2024-08-04 ENCOUNTER — Encounter: Payer: Self-pay | Admitting: Internal Medicine

## 2024-08-04 ENCOUNTER — Ambulatory Visit: Admitting: Internal Medicine

## 2024-08-04 VITALS — BP 132/80 | HR 83 | Temp 98.3°F | Ht 64.0 in | Wt 275.0 lb

## 2024-08-04 DIAGNOSIS — E559 Vitamin D deficiency, unspecified: Secondary | ICD-10-CM

## 2024-08-04 DIAGNOSIS — R051 Acute cough: Secondary | ICD-10-CM | POA: Diagnosis not present

## 2024-08-04 DIAGNOSIS — R059 Cough, unspecified: Secondary | ICD-10-CM | POA: Insufficient documentation

## 2024-08-04 DIAGNOSIS — R062 Wheezing: Secondary | ICD-10-CM | POA: Insufficient documentation

## 2024-08-04 DIAGNOSIS — I10 Essential (primary) hypertension: Secondary | ICD-10-CM | POA: Diagnosis not present

## 2024-08-04 DIAGNOSIS — R6889 Other general symptoms and signs: Secondary | ICD-10-CM

## 2024-08-04 LAB — POCT INFLUENZA A/B
Influenza A, POC: NEGATIVE
Influenza B, POC: NEGATIVE

## 2024-08-04 MED ORDER — PREDNISONE 10 MG PO TABS: ORAL_TABLET | ORAL | 0 refills | Status: AC

## 2024-08-04 MED ORDER — HYDROCODONE BIT-HOMATROP MBR 5-1.5 MG/5ML PO SOLN: 5.0000 mL | Freq: Four times a day (QID) | ORAL | 0 refills | Status: AC | PRN

## 2024-08-04 MED ORDER — DOXYCYCLINE HYCLATE 100 MG PO TABS: 100.0000 mg | ORAL_TABLET | Freq: Two times a day (BID) | ORAL | 0 refills | Status: AC

## 2024-08-04 NOTE — Assessment & Plan Note (Signed)
Mild to mod, c/w bronchitis vs pna, declines cxr, for antibx course doxycyline 100 bid, cough med prn,  to f/u any worsening symptoms or concerns

## 2024-08-04 NOTE — Patient Instructions (Addendum)
 Your Flu testing was negative  Please take all new medication as prescribed - the antibiotic, cough medicine and prednisone   Please continue all other medications as before, and refills have been done if requested.  Please have the pharmacy call with any other refills you may need.  Please keep your appointments with your specialists as you may have planned

## 2024-08-04 NOTE — Assessment & Plan Note (Signed)
 BP Readings from Last 3 Encounters:  08/04/24 132/80  05/26/24 132/88  05/15/24 132/78   Stable, pt to continue medical treatment micardis  80 mg every day, hct 12.5 every day, norvasc  5 qd

## 2024-08-04 NOTE — Assessment & Plan Note (Signed)
 Last vitamin D  Lab Results  Component Value Date   VD25OH 20.63 (L) 04/18/2024   Low, to start oral replacement
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
## 2024-08-04 NOTE — Assessment & Plan Note (Signed)
 Mild to mod, for prednisone taper, to f/u any worsening symptoms or concerns

## 2024-08-11 ENCOUNTER — Encounter: Payer: Self-pay | Admitting: Emergency Medicine

## 2024-08-11 ENCOUNTER — Ambulatory Visit: Payer: Self-pay

## 2024-08-11 ENCOUNTER — Ambulatory Visit
Admission: EM | Admit: 2024-08-11 | Discharge: 2024-08-11 | Disposition: A | Discharge status: Home / Self Care | Source: Home / Self Care

## 2024-08-11 ENCOUNTER — Ambulatory Visit (INDEPENDENT_AMBULATORY_CARE_PROVIDER_SITE_OTHER)

## 2024-08-11 DIAGNOSIS — R059 Cough, unspecified: Secondary | ICD-10-CM

## 2024-08-11 DIAGNOSIS — J209 Acute bronchitis, unspecified: Secondary | ICD-10-CM | POA: Diagnosis not present

## 2024-08-11 MED ORDER — GUAIFENESIN ER 600 MG PO TB12: 600.0000 mg | ORAL_TABLET | Freq: Two times a day (BID) | ORAL | 0 refills | Status: AC

## 2024-08-11 MED ORDER — BENZONATATE 100 MG PO CAPS: 100.0000 mg | ORAL_CAPSULE | Freq: Three times a day (TID) | ORAL | 0 refills | Status: AC

## 2024-08-11 MED ORDER — ALBUTEROL SULFATE HFA 108 (90 BASE) MCG/ACT IN AERS: 2.0000 | INHALATION_SPRAY | RESPIRATORY_TRACT | 0 refills | Status: AC | PRN

## 2024-08-11 NOTE — Discharge Instructions (Addendum)
 Discontinue use of antibiotic because you do not have a bacterial infection, chest xray shows no pneumonia. You have bronchitis, mucinex , tessalon , and inhaler will be sent to pharmacy. If symptoms do not improve in 5 days be sure to follow-up here or with PCP.

## 2024-08-11 NOTE — Telephone Encounter (Signed)
 FYI Only or Action Required?: FYI only for provider: urgent care advised.  Patient was last seen in primary care on 08/04/2024 by Norleen Lynwood ORN, MD.  Called Nurse Triage reporting Shortness of Breath and Cough.  Symptoms began a week ago.  Interventions attempted: Prescription medications: Prednisone , doxycycline , Hycodan.  Symptoms are: gradually worsening.  Triage Disposition: See HCP Within 4 Hours (Or PCP Triage)  Patient/caregiver understands and will follow disposition?: Yes                   Message from [facility] P sent at 08/11/2024  3:05 PM EST  Reason for Triage: same as last mondya if not worse   short winded and hardly walk down the hall and cant get stuff moving. and left side aching becasue trying to get it up.  need an inhaler or chest x-ray.   Reason for Disposition  [1] MILD difficulty breathing (e.g., minimal/no SOB at rest, SOB with walking, pulse < 100) AND [2] NEW-onset or WORSE than normal  Answer Assessment - Initial Assessment Questions Patient agreeable to be seen today, advised of local urgent cares and wait times.   1. RESPIRATORY STATUS: Describe your breathing? (e.g., wheezing, shortness of breath, unable to speak, severe coughing)      SOB, severe cough, wheezing until she is able to cough and get some of it up.  2. ONSET: When did this breathing problem begin?      She doesn't think she was SOB last Monday 08/04/24 when seen, started with SOB Thursday or Friday.  3. PATTERN Does the difficult breathing come and go, or has it been constant since it started?      Comes and goes.  4. SEVERITY: How bad is your breathing? (e.g., mild, moderate, severe)      Mild to moderate.  5. RECURRENT SYMPTOM: Have you had difficulty breathing before? If Yes, ask: When was the last time? and What happened that time?      Bronchitis in the past and had to be on an inhaler.  6. CARDIAC HISTORY: Do you have any history of heart  disease? (e.g., heart attack, angina, bypass surgery, angioplasty)      No cardiac disease.  7. LUNG HISTORY: Do you have any history of lung disease?  (e.g., pulmonary embolus, asthma, emphysema)     No lung disease.  8. CAUSE: What do you think is causing the breathing problem?      Unsure if pneumonia.  9. OTHER SYMPTOMS: Do you have any other symptoms? (e.g., chest pain, cough, dizziness, fever, runny nose)     Main concern can't seem to break what's in the chest. SOB with activity, walking short distance and regains breath after sitting and resting. Coughing causing left side aching. Severe coughing, feels like it isn't being brought up or expelling. Unsure to fever.  10. O2 SATURATION MONITOR:  Do you use an oxygen saturation monitor (pulse oximeter) at home? If Yes, ask: What is your reading (oxygen level) today? What is your usual oxygen saturation reading? (e.g., 95%)       No.  11. PREGNANCY: Is there any chance you are pregnant? When was your last menstrual period?       N/A.  12. TRAVEL: Have you traveled out of the country in the last month? (e.g., travel history, exposures)       No.  Protocols used: Breathing Difficulty-A-AH

## 2024-08-11 NOTE — ED Triage Notes (Signed)
 Pt presents c/o chest congestion and SOB x about 14 days. Pt states,  I need a chest xray. I went to the dr last Monday and he gave me Prednisone , cough syrup, and doxy. The sxs got better except this chest congestion. Now, I give out of breath doing the smallest things like walking from the waiting room to here.
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
